# Patient Record
Sex: Female | Born: 1985 | Race: White | Hispanic: No | Marital: Married | Smoking: Never smoker
Health system: Southern US, Community
[De-identification: ages and names within clinical notes are randomized; demographics above are authoritative.]

## PROBLEM LIST (undated history)

## (undated) DIAGNOSIS — Z87440 Personal history of urinary (tract) infections: Secondary | ICD-10-CM

## (undated) DIAGNOSIS — R011 Cardiac murmur, unspecified: Secondary | ICD-10-CM

## (undated) DIAGNOSIS — N12 Tubulo-interstitial nephritis, not specified as acute or chronic: Secondary | ICD-10-CM

## (undated) DIAGNOSIS — D649 Anemia, unspecified: Secondary | ICD-10-CM

## (undated) DIAGNOSIS — E282 Polycystic ovarian syndrome: Secondary | ICD-10-CM

## (undated) DIAGNOSIS — D696 Thrombocytopenia, unspecified: Secondary | ICD-10-CM

## (undated) DIAGNOSIS — R51 Headache: Secondary | ICD-10-CM

## (undated) DIAGNOSIS — F329 Major depressive disorder, single episode, unspecified: Secondary | ICD-10-CM

## (undated) HISTORY — DX: Cardiac murmur, unspecified: R01.1

## (undated) HISTORY — DX: Personal history of urinary (tract) infections: Z87.440

## (undated) HISTORY — DX: Headache: R51

## (undated) HISTORY — PX: COLPOSCOPY W/ BIOPSY / CURETTAGE: SUR283

## (undated) HISTORY — DX: Polycystic ovarian syndrome: E28.2

## (undated) HISTORY — PX: WISDOM TOOTH EXTRACTION: SHX21

## (undated) HISTORY — DX: Anemia, unspecified: D64.9

## (undated) HISTORY — DX: Major depressive disorder, single episode, unspecified: F32.9

## (undated) HISTORY — DX: Tubulo-interstitial nephritis, not specified as acute or chronic: N12

---
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
## 2006-03-02 ENCOUNTER — Ambulatory Visit: Payer: Self-pay | Admitting: Family Medicine

## 2006-12-28 ENCOUNTER — Ambulatory Visit: Payer: Self-pay | Admitting: Family Medicine

## 2006-12-28 ENCOUNTER — Other Ambulatory Visit: Admission: RE | Admit: 2006-12-28 | Discharge: 2006-12-28 | Payer: Self-pay | Admitting: Family Medicine

## 2008-07-28 DIAGNOSIS — D649 Anemia, unspecified: Secondary | ICD-10-CM

## 2008-07-28 HISTORY — DX: Anemia, unspecified: D64.9

## 2009-04-11 ENCOUNTER — Ambulatory Visit: Payer: Self-pay | Admitting: Family Medicine

## 2010-07-28 DIAGNOSIS — Z87440 Personal history of urinary (tract) infections: Secondary | ICD-10-CM

## 2010-07-28 DIAGNOSIS — E282 Polycystic ovarian syndrome: Secondary | ICD-10-CM

## 2010-07-28 HISTORY — DX: Personal history of urinary (tract) infections: Z87.440

## 2010-07-28 HISTORY — DX: Polycystic ovarian syndrome: E28.2

## 2012-02-26 ENCOUNTER — Ambulatory Visit (INDEPENDENT_AMBULATORY_CARE_PROVIDER_SITE_OTHER): Payer: Commercial Indemnity | Admitting: Obstetrics and Gynecology

## 2012-02-26 ENCOUNTER — Encounter: Payer: Self-pay | Admitting: Obstetrics and Gynecology

## 2012-02-26 VITALS — BP 110/72 | HR 78 | Ht 66.0 in | Wt 155.0 lb

## 2012-02-26 DIAGNOSIS — Z124 Encounter for screening for malignant neoplasm of cervix: Secondary | ICD-10-CM

## 2012-02-26 DIAGNOSIS — Z331 Pregnant state, incidental: Secondary | ICD-10-CM

## 2012-02-26 DIAGNOSIS — Z01419 Encounter for gynecological examination (general) (routine) without abnormal findings: Secondary | ICD-10-CM

## 2012-02-26 DIAGNOSIS — N912 Amenorrhea, unspecified: Secondary | ICD-10-CM

## 2012-02-26 NOTE — Progress Notes (Signed)
Regular Periods: no Mammogram: no  Monthly Breast Ex.: yes Exercise: yes  Tetanus < 10 years: yes Seatbelts: yes  NI. Bladder Functn.: yes Abuse at home: no  Daily BM's: yes Stressful Work: yes  Healthy Diet: yes Sigmoid-Colonoscopy: NO  Calcium: no Medical problems this year: NO PROBLEMS   LAST PAP:2012  NL  Contraception: CONDOMS  Mammogram:  NO  PCP:  [facility]  PMH: NO CHANGE  FMH: NO CHANGE  Last Bone Scan: NO

## 2012-02-26 NOTE — Progress Notes (Signed)
Subjective:    Berklee Battey is a 26 y.o. female, G0P0, who presents for an annual exam. The patient reports no period in July-has PCOS. Menstrual cycle:   LMP: Patient's last menstrual period was 12/28/2011.             Review of Systems Pertinent items are noted in HPI. Denies pelvic pain, urinary tract symptoms, vaginitis symptoms, irregular bleeding, menopausal symptoms, change in bowel habits or rectal bleeding   Objective:    BP 110/72  Pulse 78  Ht 5\' 6"  (1.676 m)  Wt 155 lb (70.308 kg)  BMI 25.02 kg/m2  LMP 12/28/2011    Wt Readings from Last 1 Encounters:  02/26/12 155 lb (70.308 kg)   Body mass index is 25.02 kg/(m^2). General Appearance: Alert, no acute distress HEENT: Grossly normal Neck / Thyroid: Supple, no thyromegaly or cervical adenopathy Lungs: Clear to auscultation bilaterally Back: No CVA tenderness Breast Exam: No masses or nodes.No dimpling, nipple retraction or discharge. Cardiovascular: Regular rate and rhythm.  Gastrointestinal: Soft, non-tender, no masses or organomegaly Pelvic Exam: EGBUS-wnl, vagina-normal rugae, cervix- without lesions or tenderness, uterus appears normal size shape and consistency, adnexae-no masses or tenderness Rectovaginal: no masses and normal sphincter tone Lymphatic Exam: Non-palpable nodes in neck, clavicular,  axillary, or inguinal regions  Skin: no rashes or abnormalities Extremities: no clubbing cyanosis or edema  Neurologic: grossly normal Psychiatric: Alert and oriented  UPT: positive   Assessment:   Routine GYN Exam Oligomenorrhea PCOS      Incidental Pregnancy   Plan:  QHCG-pending   PAP sent  RTO 1 year or prn  Kiley Solimine,ELMIRAPA-C

## 2012-02-27 ENCOUNTER — Telehealth: Payer: Self-pay

## 2012-02-27 ENCOUNTER — Other Ambulatory Visit: Payer: Self-pay

## 2012-02-27 ENCOUNTER — Telehealth: Payer: Self-pay | Admitting: Obstetrics and Gynecology

## 2012-02-27 DIAGNOSIS — O3680X0 Pregnancy with inconclusive fetal viability, not applicable or unspecified: Secondary | ICD-10-CM

## 2012-02-27 LAB — PAP IG W/ RFLX HPV ASCU

## 2012-02-27 NOTE — Telephone Encounter (Signed)
LM FOR PT TO CALL BACK. 

## 2012-02-27 NOTE — Progress Notes (Signed)
Quick Note:  Please schedule patient for a dating ultrasound within the next 10-14 days. She has a history of oligomenorrhea and we have no idea how pregnant she is. Thank you. EP ______

## 2012-02-27 NOTE — Telephone Encounter (Signed)
TC TO PT TO SCHEDULE A DATING U/S PER EP. WAS ADVISED TO SCHEDULE THE U/S 10-14 DAYS FROM LAB. APPT SCHEDULED ON 03/09/12 AT 3:30 PM. PT ALSO ASKED ABOUT TAKING ALEVE FOR HEADACHES AND ADVISED PT THAT SHE CAN TAKE IBUPROFEN RIGHT NOW BUT IT IS BEST TO TAKE TYLENOL UNTIL WE SEE HOW FAR ALONG PT IS. PT VOICED UNDERSTANDING

## 2012-02-27 NOTE — Telephone Encounter (Signed)
Triage/tst res °

## 2012-03-01 ENCOUNTER — Other Ambulatory Visit: Payer: Self-pay | Admitting: Obstetrics and Gynecology

## 2012-03-01 NOTE — Telephone Encounter (Signed)
Triage/gen.quest °

## 2012-03-02 NOTE — Telephone Encounter (Signed)
TRIAGE/2ND CALL RX

## 2012-03-03 ENCOUNTER — Telehealth: Payer: Self-pay

## 2012-03-03 NOTE — Telephone Encounter (Signed)
Tc to pt regarding msg.  Lm on vm to call back. 

## 2012-03-03 NOTE — Telephone Encounter (Signed)
TC TO PT REGARDING MESSAGE. PT STATED THAT SHE WANTED TO KNOW ABOUT OTHER SUPPLEMENTS THAT SHE CAN TAKE WHILE PREGNANT. PT STATES THAT SHE DID  CALL HER PCP AND HE TOLD HER A FEW THINGS ABOUT SUPPLEMENTS AND PREGNANCY. SHE ASKED COULD SHE TAKE PROGESTERONE AND I INFORMED HER THAT THE PILL SHE COULD NOT TAKE. ALSO INFORMED PT OF OTHER THINGS SHE COULD TAKE WHILE PREGNANT. PT WANTED HER U/S SCHEDULED FOR EARLIER ON THE 13TH AND I TOLD HER WE COULD SCHEDULE HER AT 11:30 AM. PT VOICED UNDERSTANDING.

## 2012-03-09 ENCOUNTER — Encounter: Payer: Self-pay | Admitting: Obstetrics and Gynecology

## 2012-03-09 ENCOUNTER — Ambulatory Visit (INDEPENDENT_AMBULATORY_CARE_PROVIDER_SITE_OTHER): Payer: Commercial Indemnity

## 2012-03-09 ENCOUNTER — Other Ambulatory Visit: Payer: Self-pay | Admitting: Obstetrics and Gynecology

## 2012-03-09 ENCOUNTER — Ambulatory Visit (INDEPENDENT_AMBULATORY_CARE_PROVIDER_SITE_OTHER): Payer: Commercial Indemnity | Admitting: Obstetrics and Gynecology

## 2012-03-09 VITALS — BP 100/70 | Wt 152.0 lb

## 2012-03-09 DIAGNOSIS — O3680X0 Pregnancy with inconclusive fetal viability, not applicable or unspecified: Secondary | ICD-10-CM

## 2012-03-09 DIAGNOSIS — Z3490 Encounter for supervision of normal pregnancy, unspecified, unspecified trimester: Secondary | ICD-10-CM

## 2012-03-09 DIAGNOSIS — Z331 Pregnant state, incidental: Secondary | ICD-10-CM

## 2012-03-09 LAB — US OB TRANSVAGINAL

## 2012-03-09 NOTE — Progress Notes (Signed)
26 YO patient with PCOS seen 02/26/12 with a positive pregnancy test and questionable LMP of 12/28/11.  QHCG=3458 on 02/26/12 she's here today for a dating ultrasound.  O:  U/S [redacted]w[redacted]d SIUP, FHR=130  A: Viable Preganancy  P: NOB     Continue MVI (has 800 mcg of folic acid)     RTO-as scheduled or prn  Ozias Dicenzo, PA-C

## 2012-03-23 ENCOUNTER — Ambulatory Visit (INDEPENDENT_AMBULATORY_CARE_PROVIDER_SITE_OTHER): Payer: Commercial Indemnity | Admitting: Obstetrics and Gynecology

## 2012-03-23 DIAGNOSIS — Z331 Pregnant state, incidental: Secondary | ICD-10-CM

## 2012-03-23 LAB — POCT URINALYSIS DIPSTICK
Glucose, UA: NEGATIVE
Ketones, UA: NEGATIVE
Leukocytes, UA: NEGATIVE
Spec Grav, UA: 1.01

## 2012-03-23 NOTE — Progress Notes (Signed)
NOB INTERVIEW. EDC BY LMP AND U/S REVIEWED BY DD.  TO USE U/S FOR DATING. PT HAD BEEN TAKING PROGESTERONE TO REGULATE MENSES. PER DD DOES NOT NEED AT THIS TIME.

## 2012-03-24 DIAGNOSIS — Z331 Pregnant state, incidental: Secondary | ICD-10-CM | POA: Insufficient documentation

## 2012-03-24 LAB — PRENATAL PANEL VII
HIV: NONREACTIVE
Hepatitis B Surface Ag: NEGATIVE
Lymphocytes Relative: 30 % (ref 12–46)
Lymphs Abs: 2 10*3/uL (ref 0.7–4.0)
MCV: 90.3 fL (ref 78.0–100.0)
Neutro Abs: 4 10*3/uL (ref 1.7–7.7)
Neutrophils Relative %: 61 % (ref 43–77)
Platelets: 211 10*3/uL (ref 150–400)
RBC: 4.02 MIL/uL (ref 3.87–5.11)
Rubella: 34.8 IU/mL — ABNORMAL HIGH
WBC: 6.5 10*3/uL (ref 4.0–10.5)

## 2012-03-24 LAB — TOXOPLASMA ANTIBODIES- IGG AND  IGM
Toxoplasma Antibody- IgM: <3 IU/mL (ref <8.0)
Toxoplasma IgG Ratio: <3 IU/mL (ref <6.0)

## 2012-03-25 LAB — CULTURE, OB URINE
Colony Count: NO GROWTH
Organism ID, Bacteria: NO GROWTH

## 2012-04-06 ENCOUNTER — Telehealth: Payer: Self-pay | Admitting: Obstetrics and Gynecology

## 2012-04-06 ENCOUNTER — Ambulatory Visit (INDEPENDENT_AMBULATORY_CARE_PROVIDER_SITE_OTHER): Payer: Commercial Indemnity | Admitting: Obstetrics and Gynecology

## 2012-04-06 ENCOUNTER — Other Ambulatory Visit: Payer: Self-pay | Admitting: Obstetrics and Gynecology

## 2012-04-06 ENCOUNTER — Encounter: Payer: Self-pay | Admitting: Obstetrics and Gynecology

## 2012-04-06 VITALS — BP 108/72 | Wt 151.0 lb

## 2012-04-06 DIAGNOSIS — Z87440 Personal history of urinary (tract) infections: Secondary | ICD-10-CM

## 2012-04-06 DIAGNOSIS — R010 Benign and innocent cardiac murmurs: Secondary | ICD-10-CM

## 2012-04-06 DIAGNOSIS — R011 Cardiac murmur, unspecified: Secondary | ICD-10-CM

## 2012-04-06 DIAGNOSIS — E282 Polycystic ovarian syndrome: Secondary | ICD-10-CM

## 2012-04-06 DIAGNOSIS — N898 Other specified noninflammatory disorders of vagina: Secondary | ICD-10-CM

## 2012-04-06 DIAGNOSIS — Z331 Pregnant state, incidental: Secondary | ICD-10-CM

## 2012-04-06 DIAGNOSIS — Z87448 Personal history of other diseases of urinary system: Secondary | ICD-10-CM

## 2012-04-06 LAB — POCT WET PREP (WET MOUNT)

## 2012-04-06 MED ORDER — TERCONAZOLE 0.4 % VA CREA: 1.0000 | TOPICAL_CREAM | Freq: Every day | VAGINAL | Status: AC

## 2012-04-06 NOTE — Progress Notes (Signed)
LAST PAP: 02/26/2012 "WNL" GC/CT today. Pt declines genetic testing. Pt states vaginal discharge is normal. Pt states she is getting more headaches.  Pt requesting U/S today if possible to be sure baby is "OK".

## 2012-04-06 NOTE — Progress Notes (Signed)
Addendum: breast exam neg bilaterally dimpling, drainage, or masses. Lavera Guise, CNM

## 2012-04-06 NOTE — Progress Notes (Signed)
Patient ID: [name], female   DOB: 1986-03-13, 26 y.o.   MRN: [id number] Maria Douglas is a 26 y.o. female presenting for new ob visit. [place] at 6 4/7 weeks will use for dating fo [facility] 10/29/12 @MED  @IPILAPH @ OB History    Grav Para Term Preterm Abortions TAB SAB Ect Mult Living   1              Past Medical History  Diagnosis Date  . PCOS (polycystic ovarian syndrome) 2012  . Hx: UTI (urinary tract infection) 2012  . Heart murmur     HAD LONG TIME; WAS ADVISED PROPHYLAXSIS FOR DENTIST  . Depression 2005    NO MEDS  . Anemia 2010    NO MEDS  . Headache     OCC  HX of heart murmur as infant without problems Past Surgical History  Procedure Date  . Wisdom tooth extraction    Family History: family history includes Anemia in her paternal grandfather; Cancer in her maternal grandfather and paternal grandfather; Depression in her mother; Down syndrome in her paternal uncle; Early death (age of onset:46) in her maternal grandmother; Heart disease in her maternal grandfather, mother, and paternal grandfather; Hypertension in her paternal grandmother; Migraines in her mother; Mitral valve prolapse in her mother; Other in her mother; and Stroke in her maternal grandfather and paternal uncle. Social History:  reports that she has never smoked. She has never used smokeless tobacco. She reports that she drinks about 2 - 2.5 ounces of alcohol per week. She reports that she does not use illicit drugs.  @ROS @    Blood pressure 108/72, weight 151 lb (68.493 kg), last menstrual period 12/28/2011. Physical exam: Calm, no distress, HEENT wnl lungs clear bilaterally, AP RRR, abd soft, gravid, nt, bowel sounds active, abdomen nontender, Fundal height 12 Normal hair distrubition mons pubis,  EGBUS WNL, sterile speculum exam,  vagina pink, moist normal rugae,  cerix LTC, no cervical motion tenderness, No adnexal masses or tenderness Scant white discharge uterus firm.Marland Kitchen..Marland Kitchenweeks size 12 DTR + 2 no  clonus No edema to lower extremities  Prenatal labs: ABO, Rh: A/POS/-- (08/27 0932) Antibody: NEG (08/27 0932) Rubella:  immune RPR: NON REAC (08/27 0932)  HBsAg: NEGATIVE (08/27 0932)  HIV: NON REACTIVE (08/27 0932)  GBS:  na  Assessment/Plan: [redacted]w[redacted]d GC/CHL sent WET PREP positive Hyphae, neg clue and neg trich PAP WNL at [facility] 8/13  ULTRASOUND 6w 4 d for dating terazol 7 discussed, Genetic testing declined Collaboration with Dr. Estanislado Pandy. Maria Douglas 04/06/2012, 11:10 PM Lavera Guise, CNM

## 2012-04-06 NOTE — Telephone Encounter (Signed)
TRIAGE/RX FOLLOW UP °

## 2012-04-07 ENCOUNTER — Telehealth: Payer: Self-pay

## 2012-04-07 LAB — GC/CHLAMYDIA PROBE AMP, GENITAL
Chlamydia, DNA Probe: NEGATIVE
GC Probe Amp, Genital: NEGATIVE

## 2012-04-07 NOTE — Telephone Encounter (Signed)
Rx for Terozole 7 was not sent to her pharm.when she left the office. Rx was e-scribed late Tuesday afternoon after 5pm. I called the pharm to confirm that Rx was received.  [facility] CMA

## 2012-05-04 ENCOUNTER — Ambulatory Visit (INDEPENDENT_AMBULATORY_CARE_PROVIDER_SITE_OTHER): Payer: Commercial Indemnity | Admitting: Obstetrics and Gynecology

## 2012-05-04 VITALS — BP 100/62 | Wt 154.0 lb

## 2012-05-04 DIAGNOSIS — Z331 Pregnant state, incidental: Secondary | ICD-10-CM

## 2012-05-04 NOTE — Progress Notes (Signed)
[redacted]w[redacted]d GC/CT both neg Reviewed PNL Declined quad screen Anatomy scan at [facility] Will do routine glucola testing at 26-28wks (pt had questions about an early glucola and would have been agreeable to it but her only risk factor is the PCOs so I think it would be fine to do routine testing)

## 2012-06-01 ENCOUNTER — Ambulatory Visit (INDEPENDENT_AMBULATORY_CARE_PROVIDER_SITE_OTHER): Payer: Commercial Indemnity | Admitting: Obstetrics and Gynecology

## 2012-06-01 ENCOUNTER — Ambulatory Visit (INDEPENDENT_AMBULATORY_CARE_PROVIDER_SITE_OTHER): Payer: Commercial Indemnity

## 2012-06-01 VITALS — BP 110/60 | Wt 158.0 lb

## 2012-06-01 DIAGNOSIS — Z331 Pregnant state, incidental: Secondary | ICD-10-CM

## 2012-06-01 DIAGNOSIS — Z3689 Encounter for other specified antenatal screening: Secondary | ICD-10-CM

## 2012-06-01 DIAGNOSIS — R300 Dysuria: Secondary | ICD-10-CM

## 2012-06-01 LAB — US OB COMP + 14 WK

## 2012-06-01 NOTE — Progress Notes (Signed)
[redacted]w[redacted]d Anatomy US today  S=D EFW 52% Cx=3.64cm Posterior placenta Normal fluid  No anomalies noted, limited views of: profile, NB, RVOT, LVOT, DA cx closed, nl adenexa and ovaries  Pt denies urinary sx's will send UA for cx secondary to hx of pyelo and frequent UTI Denies quad screen RTO 2wks for f/u US 4wks ROB

## 2012-06-01 NOTE — Progress Notes (Signed)
Pt declines flu shot @ this time 

## 2012-06-01 NOTE — Patient Instructions (Signed)
Round Ligament Pain  The round ligament is made up of muscle and fibrous tissue. It is attached to the uterus near the fallopian tube. The round ligament is located on both sides of the uterus and helps support the position of the uterus. It usually begins in the second trimester of pregnancy when the uterus comes out of the pelvis. The pain can come and go until the baby is delivered. Round ligament pain is not a serious problem and does not cause harm to the baby.  CAUSE  During pregnancy the uterus grows the most from the second trimester to delivery. As it grows, it stretches and slightly twists the round ligaments. When the uterus leans from one side to the other, the round ligament on the opposite side pulls and stretches. This can cause pain.  SYMPTOMS   Pain can occur on one side or both sides. The pain is usually a short, sharp, and pinching-like. Sometimes it can be a dull, lingering and aching pain. The pain is located in the lower side of the abdomen or in the groin. The pain is internal and usually starts deep in the groin and moves up to the outside of the hip area. Pain can occur with:  · Sudden change in position like getting out of bed or a chair.  · Rolling over in bed.  · Coughing or sneezing.  · Walking too much.  · Any type of physical activity.  DIAGNOSIS   Your caregiver will make sure there are no serious problems causing the pain. When nothing serious is found, the symptoms usually indicate that the pain is from the round ligament.  TREATMENT   · Sit down and relax when the pain starts.  · Flex your knees up to your belly.  · Lay on your side with a pillow under your belly (abdomen) and another one between your legs.  · Sit in a hot bath for 15 to 20 minutes or until the pain goes away.  HOME CARE INSTRUCTIONS   · Only take over-the-counter or prescriptions medicines for pain, discomfort or fever as directed by your caregiver.  · Sit and stand slowly.  · Avoid long walks if it causes  pain.  · Stop or lessen your physical activities if it causes pain.  SEEK MEDICAL CARE IF:   · The pain does not go away with any of your treatment.  · You need stronger medication for the pain.  · You develop back pain that you did not have before with the side pain.  SEEK IMMEDIATE MEDICAL CARE IF:   · You develop a temperature of 102° F (38.9° C) or higher.  · You develop uterine contractions.  · You develop vaginal bleeding.  · You develop nausea, vomiting or diarrhea.  · You develop chills.  · You have pain when you urinate.  Document Released: 04/22/2008 Document Revised: 10/06/2011 Document Reviewed: 04/22/2008  ExitCare® Patient Information ©2013 [employer].

## 2012-06-10 ENCOUNTER — Other Ambulatory Visit: Payer: Self-pay

## 2012-06-10 DIAGNOSIS — Z3689 Encounter for other specified antenatal screening: Secondary | ICD-10-CM

## 2012-06-14 ENCOUNTER — Ambulatory Visit (INDEPENDENT_AMBULATORY_CARE_PROVIDER_SITE_OTHER): Payer: Commercial Indemnity | Admitting: Obstetrics and Gynecology

## 2012-06-14 ENCOUNTER — Other Ambulatory Visit: Payer: Self-pay | Admitting: Obstetrics and Gynecology

## 2012-06-14 ENCOUNTER — Ambulatory Visit (INDEPENDENT_AMBULATORY_CARE_PROVIDER_SITE_OTHER): Payer: Commercial Indemnity

## 2012-06-14 VITALS — BP 100/60 | Wt 159.0 lb

## 2012-06-14 DIAGNOSIS — Z3689 Encounter for other specified antenatal screening: Secondary | ICD-10-CM

## 2012-06-14 DIAGNOSIS — Z331 Pregnant state, incidental: Secondary | ICD-10-CM

## 2012-06-14 NOTE — Progress Notes (Signed)
[redacted]w[redacted]d Ultrasound: Single gestation, normal fluid, normal anatomy, cervix 3.35 cm, 14 ounces (57th percentile), female Genetic screening declined. Return to office in 4 weeks. Dr. Stefano Gaul

## 2012-06-14 NOTE — Progress Notes (Signed)
[redacted]w[redacted]d  U/S: Anatomy F/U:  Breech presentation. Posterior placenta. No previa. Normal Fluid, AP pocket = 5.4 CM  Normal Linear growth : EFW = 57th %tile. DA, AA, RVOT, LVOT seen today. Profile and NB not seen due to fetal position.  CX closed. Normal ovaries/adnexa.

## 2012-06-15 LAB — US OB FOLLOW UP

## 2012-06-30 ENCOUNTER — Encounter: Payer: Commercial Indemnity | Admitting: Obstetrics and Gynecology

## 2012-07-12 ENCOUNTER — Ambulatory Visit (INDEPENDENT_AMBULATORY_CARE_PROVIDER_SITE_OTHER): Payer: Commercial Indemnity | Admitting: Obstetrics and Gynecology

## 2012-07-12 VITALS — BP 110/62 | Wt 165.0 lb

## 2012-07-12 DIAGNOSIS — Z331 Pregnant state, incidental: Secondary | ICD-10-CM

## 2012-07-12 NOTE — Progress Notes (Signed)
[redacted]w[redacted]d Pt states she is having some tingling and numbness is leg. No complaints.

## 2012-07-12 NOTE — Progress Notes (Signed)
[redacted]w[redacted]d Doing well. Examination of the extremities is normal. Glucola next visit. Return office in 4 weeks. Dr. Stefano Gaul

## 2012-07-28 NOTE — L&D Delivery Note (Signed)
Delivery Note  Labored down about an hour, then had stronger urge to push, FHR remained reassuring w occ early variable and more frequent variables just prior to delivery, pushed approximately 1 hour   At 4:04 PM a viable female was delivered via Vaginal, Spontaneous Delivery (Presentation: Left Occiput Anterior).  Loose nuchal cord reduced, shoulders delivered easily, APGAR: 7, 9; weight 7 lb 9.7 oz (3450 g).   Placenta status: Intact, Spontaneous.  Cord: 3 vessels with the following complications: None.  Cord pH: n/a   Anesthesia: Epidural, local  Episiotomy: None Lacerations: periurethral, 2nd degree perineal  Suture Repair: 3.0 vicryl rapide 4-0 monocryl Est. Blood Loss (mL): 300  Red robinson catheter placed and left in place for visualization and repair of periurethral laceration, removed when repair completed   Mom to postpartum.  Baby to nursery-stable. Pt plans to BF Desires inpatient circumcision Infant remains skin-skin  Routine PP orders   Veneda Kirksey M 11/08/2012, 1:14 AM

## 2012-08-09 ENCOUNTER — Other Ambulatory Visit: Payer: Commercial Indemnity

## 2012-08-09 ENCOUNTER — Ambulatory Visit (INDEPENDENT_AMBULATORY_CARE_PROVIDER_SITE_OTHER): Payer: Commercial Indemnity | Admitting: Obstetrics and Gynecology

## 2012-08-09 ENCOUNTER — Encounter: Payer: Self-pay | Admitting: Obstetrics and Gynecology

## 2012-08-09 VITALS — BP 110/68 | Wt 169.0 lb

## 2012-08-09 DIAGNOSIS — Z331 Pregnant state, incidental: Secondary | ICD-10-CM

## 2012-08-09 NOTE — Progress Notes (Signed)
Glucola given today. 

## 2012-08-09 NOTE — Progress Notes (Signed)
[redacted]w[redacted]d A/P Glucola, hemoglobin and RPR today Fetal kick counts reviewed All patients questions answered Return in two weeks Continue Prenatal vitamins Blood type A pos

## 2012-08-09 NOTE — Patient Instructions (Signed)
Fetal Movement Counts Patient Name: __________________________________________________ Patient Due Date: ____________________ Kick counts is highly recommended in high risk pregnancies, but it is a good idea for every pregnant woman to do. Start counting fetal movements at 28 weeks of the pregnancy. Fetal movements increase after eating a full meal or eating or drinking something sweet (the blood sugar is higher). It is also important to drink plenty of fluids (well hydrated) before doing the count. Lie on your left side because it helps with the circulation or you can sit in a comfortable chair with your arms over your belly (abdomen) with no distractions around you. DOING THE COUNT  Try to do the count the same time of day each time you do it.  Mark the day and time, then see how long it takes for you to feel 10 movements (kicks, flutters, swishes, rolls). You should have at least 10 movements within 2 hours. You will most likely feel 10 movements in much less than 2 hours. If you do not, wait an hour and count again. After a couple of days you will see a pattern.  What you are looking for is a change in the pattern or not enough counts in 2 hours. Is it taking longer in time to reach 10 movements? SEEK MEDICAL CARE IF:  You feel less than 10 counts in 2 hours. Tried twice.  No movement in one hour.  The pattern is changing or taking longer each day to reach 10 counts in 2 hours.  You feel the baby is not moving as it usually does. Date: ____________ Movements: ____________ Start time: ____________ Finish time: ____________  Date: ____________ Movements: ____________ Start time: ____________ Finish time: ____________ Date: ____________ Movements: ____________ Start time: ____________ Finish time: ____________ Date: ____________ Movements: ____________ Start time: ____________ Finish time: ____________ Date: ____________ Movements: ____________ Start time: ____________ Finish time:  ____________ Date: ____________ Movements: ____________ Start time: ____________ Finish time: ____________ Date: ____________ Movements: ____________ Start time: ____________ Finish time: ____________ Date: ____________ Movements: ____________ Start time: ____________ Finish time: ____________  Date: ____________ Movements: ____________ Start time: ____________ Finish time: ____________ Date: ____________ Movements: ____________ Start time: ____________ Finish time: ____________ Date: ____________ Movements: ____________ Start time: ____________ Finish time: ____________ Date: ____________ Movements: ____________ Start time: ____________ Finish time: ____________ Date: ____________ Movements: ____________ Start time: ____________ Finish time: ____________ Date: ____________ Movements: ____________ Start time: ____________ Finish time: ____________ Date: ____________ Movements: ____________ Start time: ____________ Finish time: ____________  Date: ____________ Movements: ____________ Start time: ____________ Finish time: ____________ Date: ____________ Movements: ____________ Start time: ____________ Finish time: ____________ Date: ____________ Movements: ____________ Start time: ____________ Finish time: ____________ Date: ____________ Movements: ____________ Start time: ____________ Finish time: ____________ Date: ____________ Movements: ____________ Start time: ____________ Finish time: ____________ Date: ____________ Movements: ____________ Start time: ____________ Finish time: ____________ Date: ____________ Movements: ____________ Start time: ____________ Finish time: ____________  Date: ____________ Movements: ____________ Start time: ____________ Finish time: ____________ Date: ____________ Movements: ____________ Start time: ____________ Finish time: ____________ Date: ____________ Movements: ____________ Start time: ____________ Finish time: ____________ Date: ____________ Movements:  ____________ Start time: ____________ Finish time: ____________ Date: ____________ Movements: ____________ Start time: ____________ Finish time: ____________ Date: ____________ Movements: ____________ Start time: ____________ Finish time: ____________ Date: ____________ Movements: ____________ Start time: ____________ Finish time: ____________  Date: ____________ Movements: ____________ Start time: ____________ Finish time: ____________ Date: ____________ Movements: ____________ Start time: ____________ Finish time: ____________ Date: ____________ Movements: ____________ Start time: ____________ Finish time: ____________ Date: ____________ Movements:   ____________ Start time: ____________ Finish time: ____________ Date: ____________ Movements: ____________ Start time: ____________ Finish time: ____________ Date: ____________ Movements: ____________ Start time: ____________ Finish time: ____________ Date: ____________ Movements: ____________ Start time: ____________ Finish time: ____________  Date: ____________ Movements: ____________ Start time: ____________ Finish time: ____________ Date: ____________ Movements: ____________ Start time: ____________ Finish time: ____________ Date: ____________ Movements: ____________ Start time: ____________ Finish time: ____________ Date: ____________ Movements: ____________ Start time: ____________ Finish time: ____________ Date: ____________ Movements: ____________ Start time: ____________ Finish time: ____________ Date: ____________ Movements: ____________ Start time: ____________ Finish time: ____________ Date: ____________ Movements: ____________ Start time: ____________ Finish time: ____________  Date: ____________ Movements: ____________ Start time: ____________ Finish time: ____________ Date: ____________ Movements: ____________ Start time: ____________ Finish time: ____________ Date: ____________ Movements: ____________ Start time: ____________ Finish  time: ____________ Date: ____________ Movements: ____________ Start time: ____________ Finish time: ____________ Date: ____________ Movements: ____________ Start time: ____________ Finish time: ____________ Date: ____________ Movements: ____________ Start time: ____________ Finish time: ____________ Date: ____________ Movements: ____________ Start time: ____________ Finish time: ____________  Date: ____________ Movements: ____________ Start time: ____________ Finish time: ____________ Date: ____________ Movements: ____________ Start time: ____________ Finish time: ____________ Date: ____________ Movements: ____________ Start time: ____________ Finish time: ____________ Date: ____________ Movements: ____________ Start time: ____________ Finish time: ____________ Date: ____________ Movements: ____________ Start time: ____________ Finish time: ____________ Date: ____________ Movements: ____________ Start time: ____________ Finish time: ____________ Document Released: 08/13/2006 Document Revised: 10/06/2011 Document Reviewed: 02/13/2009 Exit[employer] Patient Information 2013 [employer].  

## 2012-08-23 ENCOUNTER — Ambulatory Visit: Payer: Commercial Indemnity | Admitting: Obstetrics and Gynecology

## 2012-08-23 VITALS — BP 104/62 | Wt 169.0 lb

## 2012-08-23 DIAGNOSIS — Z331 Pregnant state, incidental: Secondary | ICD-10-CM

## 2012-08-23 NOTE — Progress Notes (Signed)
[redacted]w[redacted]d Normal 1hr gtt 87, Hgb normal and RPR non reactive. No complaints today.

## 2012-08-23 NOTE — Progress Notes (Signed)
[redacted]w[redacted]d Doing well. Hemoglobin 12.  Glucola 87.  RPR nonreactive. Return office in 2 weeks. Dr. Stefano Gaul

## 2012-09-06 ENCOUNTER — Ambulatory Visit: Payer: Commercial Indemnity | Admitting: Obstetrics and Gynecology

## 2012-09-06 VITALS — BP 102/64 | Wt 174.0 lb

## 2012-09-06 DIAGNOSIS — Z331 Pregnant state, incidental: Secondary | ICD-10-CM

## 2012-09-06 NOTE — Progress Notes (Signed)
[redacted]w[redacted]d The patient complains of occasional increase in her heart rate.  No shortness of breath or chest pain. Grade 1/6 systolic murmur.  Chest is clear. Return to office in 2 weeks. Dr. Stefano Gaul

## 2012-09-06 NOTE — Progress Notes (Signed)
Pt stated noticed her heart rate would speed up sometimes. Pt's mom has the MVP.pt stated no other issues today. Pulse 76

## 2012-09-20 ENCOUNTER — Ambulatory Visit: Payer: Commercial Indemnity | Admitting: Obstetrics and Gynecology

## 2012-09-20 ENCOUNTER — Encounter: Payer: Self-pay | Admitting: Obstetrics and Gynecology

## 2012-09-20 VITALS — BP 102/70 | Wt 177.0 lb

## 2012-09-20 DIAGNOSIS — Z331 Pregnant state, incidental: Secondary | ICD-10-CM

## 2012-09-20 NOTE — Progress Notes (Signed)
[redacted]w[redacted]d Pt w/o complaint.  Gc/chl/gbs and chem 10 u/a at [facility]

## 2012-10-04 ENCOUNTER — Encounter: Payer: Self-pay | Admitting: Obstetrics and Gynecology

## 2012-10-04 ENCOUNTER — Ambulatory Visit: Payer: Commercial Indemnity | Admitting: Obstetrics and Gynecology

## 2012-10-04 VITALS — BP 100/64 | Wt 176.0 lb

## 2012-10-04 LAB — POCT URINALYSIS DIPSTICK
Bilirubin, UA: NEGATIVE
Glucose, UA: NEGATIVE
Leukocytes, UA: NEGATIVE
Nitrite, UA: NEGATIVE

## 2012-10-04 NOTE — Progress Notes (Signed)
Pt declines flu shot at this time.

## 2012-10-04 NOTE — Progress Notes (Signed)
[redacted]w[redacted]d A/P GBS done today.  Pt declined GC and CHLAm Fetal kick counts reviewed Labor reviewed with pt All patients  questions answered

## 2012-10-06 LAB — STREP B DNA PROBE: GBSP: NEGATIVE

## 2012-10-11 ENCOUNTER — Ambulatory Visit: Payer: Commercial Indemnity | Admitting: Family Medicine

## 2012-10-11 VITALS — BP 122/62 | Wt 182.0 lb

## 2012-10-11 DIAGNOSIS — Z331 Pregnant state, incidental: Secondary | ICD-10-CM

## 2012-10-11 NOTE — Progress Notes (Signed)
[redacted]w[redacted]d No complaints today.

## 2012-10-11 NOTE — Progress Notes (Signed)
[redacted]w[redacted]d Doing well, good fetal movement.   Denies contractions, declines cervical exam today. ROB in 1 week. L.Givanni Staron, FNP-BC

## 2012-11-05 ENCOUNTER — Encounter (HOSPITAL_COMMUNITY): Payer: Self-pay | Admitting: *Deleted

## 2012-11-05 ENCOUNTER — Inpatient Hospital Stay (HOSPITAL_COMMUNITY)
Admission: AD | Admit: 2012-11-05 | Discharge: 2012-11-05 | Disposition: A | Discharge status: Home / Self Care | Payer: Self-pay | Source: Ambulatory Visit | Attending: Obstetrics and Gynecology | Admitting: Obstetrics and Gynecology

## 2012-11-05 DIAGNOSIS — Z0371 Encounter for suspected problem with amniotic cavity and membrane ruled out: Secondary | ICD-10-CM

## 2012-11-05 DIAGNOSIS — O99891 Other specified diseases and conditions complicating pregnancy: Secondary | ICD-10-CM | POA: Insufficient documentation

## 2012-11-05 NOTE — MAU Note (Signed)
Pt presents with complaints of leakage of fluid that started at approximately 1145 today with clear fluid.

## 2012-11-05 NOTE — Progress Notes (Signed)
Ambulated pt around the unit while on pulsox monitor. Pt's O2 levels dropped to 93 at worst. Pt states she still feels pressure on her chest. Joseph Berkshire PA notified

## 2012-11-05 NOTE — MAU Provider Note (Signed)
History  27yo, G1P0 at [redacted]w[redacted]d presents after calling the office with potential SROM. Small gush at 1145 after getting up out of bed.  Denies VB or UC/cramping.  GBS neg.  No chief complaint on file.   OB History   Grav Para Term Preterm Abortions TAB SAB Ect Mult Living   1               Past Medical History  Diagnosis Date  . PCOS (polycystic ovarian syndrome) 2012  . Hx: UTI (urinary tract infection) 2012  . Heart murmur     HAD LONG TIME; WAS ADVISED PROPHYLAXSIS FOR DENTIST  . Depression 2005    NO MEDS  . Anemia 2010    NO MEDS  . Headache     OCC    Past Surgical History  Procedure Laterality Date  . Wisdom tooth extraction      Family History  Problem Relation Age of Onset  . Mitral valve prolapse Mother   . Migraines Mother   . Depression Mother   . Heart disease Mother     MVP  . Other Mother     VARICOSE VEINS  . Stroke Paternal Uncle   . Down syndrome Paternal Uncle   . Cancer Paternal Grandfather     COLON  . Anemia Paternal Grandfather   . Heart disease Paternal Grandfather   . Early death Maternal Grandmother 17    DIED DURING SURGERY  . Cancer Maternal Grandfather     COLON  . Heart disease Maternal Grandfather   . Stroke Maternal Grandfather   . Hypertension Paternal Grandmother     History  Substance Use Topics  . Smoking status: Never Smoker   . Smokeless tobacco: Never Used  . Alcohol Use: 2 - 2.5 oz/week    4-5 drink(s) per week     Comment: MIXED DRINKS; BEER; D/C'D 02/24/12    Allergies:  Allergies  Allergen Reactions  . Minocycline Swelling    Itching    Prescriptions prior to admission  Medication Sig Dispense Refill  . Ascorbic Acid (VITAMIN C) 100 MG tablet Take 100 mg by mouth daily.      . cholecalciferol (VITAMIN D) 1000 UNITS tablet Take 1,000 Units by mouth daily.      . COD LIVER OIL PO Take by mouth.      . Multiple Vitamin (MULTIVITAMIN) tablet Take by mouth daily.      . Prenatal Vit-Fe Sulfate-FA (PRENATAL  VITAMIN PO) Take by mouth.      . vitamin B-12 (CYANOCOBALAMIN) 250 MCG tablet Take 250 mcg by mouth daily.        ROS: See HPI above, all other systems negative  Physical Exam   Filed Vitals:   11/05/12 1448  BP: 118/88  Pulse: 99  Temp: 98.7 F (37.1 C)  TempSrc: Oral  Resp: 18   Last menstrual period 12/28/2011.  Chest: Clear Heart: RRR Abdomen: gravid, NT Pelvic:1-2 / 30% / -3  Posterior;  No VB noted Extremities: WNL  NST reactive UC: occational  ED Course  IUP at [redacted]w[redacted]d ? SROM Amnisure negative  Discharge home Encouraged to call with any questions or concerns or if symptoms return or worsen Pt instructions given   Haroldine Laws CNM, [place] 11/05/2012 2:31 PM

## 2012-11-07 ENCOUNTER — Inpatient Hospital Stay (HOSPITAL_COMMUNITY): Payer: Managed Care, Other (non HMO) | Admitting: Anesthesiology

## 2012-11-07 ENCOUNTER — Encounter (HOSPITAL_COMMUNITY): Payer: Self-pay | Admitting: Anesthesiology

## 2012-11-07 ENCOUNTER — Encounter (HOSPITAL_COMMUNITY): Payer: Self-pay

## 2012-11-07 ENCOUNTER — Inpatient Hospital Stay (HOSPITAL_COMMUNITY)
Admission: AD | Admit: 2012-11-07 | Discharge: 2012-11-09 | DRG: 775 | Disposition: A | Discharge status: Home / Self Care | Payer: Managed Care, Other (non HMO) | Source: Ambulatory Visit | Attending: Obstetrics and Gynecology | Admitting: Obstetrics and Gynecology

## 2012-11-07 DIAGNOSIS — O480 Post-term pregnancy: Principal | ICD-10-CM | POA: Diagnosis present

## 2012-11-07 DIAGNOSIS — IMO0001 Reserved for inherently not codable concepts without codable children: Secondary | ICD-10-CM

## 2012-11-07 DIAGNOSIS — O34599 Maternal care for other abnormalities of gravid uterus, unspecified trimester: Secondary | ICD-10-CM | POA: Diagnosis present

## 2012-11-07 DIAGNOSIS — E282 Polycystic ovarian syndrome: Secondary | ICD-10-CM | POA: Diagnosis present

## 2012-11-07 LAB — CBC
HCT: 36.1 % (ref 36.0–46.0)
MCV: 92.6 fL (ref 78.0–100.0)
Platelets: 120 10*3/uL — ABNORMAL LOW (ref 150–400)
RBC: 3.9 MIL/uL (ref 3.87–5.11)
WBC: 9.7 10*3/uL (ref 4.0–10.5)

## 2012-11-07 MED ORDER — FLEET ENEMA 7-19 GM/118ML RE ENEM: 1.0000 | ENEMA | Freq: Every day | RECTAL | Status: DC | PRN

## 2012-11-07 MED ORDER — PHENYLEPHRINE 40 MCG/ML (10ML) SYRINGE FOR IV PUSH (FOR BLOOD PRESSURE SUPPORT)
80.0000 ug | PREFILLED_SYRINGE | INTRAVENOUS | Status: DC | PRN
  Filled 2012-11-07: qty 2
  Filled 2012-11-07: qty 5

## 2012-11-07 MED ORDER — OXYCODONE-ACETAMINOPHEN 5-325 MG PO TABS: 1.0000 | ORAL_TABLET | ORAL | Status: DC | PRN

## 2012-11-07 MED ORDER — ACETAMINOPHEN 325 MG PO TABS: 650.0000 mg | ORAL_TABLET | ORAL | Status: DC | PRN

## 2012-11-07 MED ORDER — WITCH HAZEL-GLYCERIN EX PADS
1.0000 "application " | MEDICATED_PAD | CUTANEOUS | Status: DC | PRN
  Administered 2012-11-08: 1 via TOPICAL

## 2012-11-07 MED ORDER — OXYTOCIN 40 UNITS IN LACTATED RINGERS INFUSION - SIMPLE MED
62.5000 mL/h | INTRAVENOUS | Status: DC
  Administered 2012-11-07: 62.5 mL/h via INTRAVENOUS

## 2012-11-07 MED ORDER — LANOLIN HYDROUS EX OINT: TOPICAL_OINTMENT | CUTANEOUS | Status: DC | PRN

## 2012-11-07 MED ORDER — ZOLPIDEM TARTRATE 5 MG PO TABS: 5.0000 mg | ORAL_TABLET | Freq: Every evening | ORAL | Status: DC | PRN

## 2012-11-07 MED ORDER — LACTATED RINGERS IV SOLN
500.0000 mL | INTRAVENOUS | Status: DC | PRN
  Administered 2012-11-07: 500 mL via INTRAVENOUS
  Administered 2012-11-07: 1000 mL via INTRAVENOUS

## 2012-11-07 MED ORDER — SIMETHICONE 80 MG PO CHEW: 80.0000 mg | CHEWABLE_TABLET | ORAL | Status: DC | PRN

## 2012-11-07 MED ORDER — IBUPROFEN 600 MG PO TABS: 600.0000 mg | ORAL_TABLET | Freq: Four times a day (QID) | ORAL | Status: DC | PRN

## 2012-11-07 MED ORDER — FENTANYL 2.5 MCG/ML BUPIVACAINE 1/10 % EPIDURAL INFUSION (WH - ANES)
14.0000 mL/h | INTRAMUSCULAR | Status: DC | PRN
  Administered 2012-11-07: 14 mL/h via EPIDURAL
  Filled 2012-11-07 (×2): qty 125

## 2012-11-07 MED ORDER — SENNOSIDES-DOCUSATE SODIUM 8.6-50 MG PO TABS
2.0000 | ORAL_TABLET | Freq: Every day | ORAL | Status: DC
  Administered 2012-11-07 – 2012-11-08 (×2): 2 via ORAL

## 2012-11-07 MED ORDER — ONDANSETRON HCL 4 MG/2ML IJ SOLN: 4.0000 mg | INTRAMUSCULAR | Status: DC | PRN

## 2012-11-07 MED ORDER — ONDANSETRON HCL 4 MG/2ML IJ SOLN
4.0000 mg | Freq: Four times a day (QID) | INTRAMUSCULAR | Status: DC | PRN
  Administered 2012-11-07: 4 mg via INTRAVENOUS
  Filled 2012-11-07: qty 2

## 2012-11-07 MED ORDER — EPHEDRINE 5 MG/ML INJ
10.0000 mg | INTRAVENOUS | Status: DC | PRN
  Filled 2012-11-07: qty 4
  Filled 2012-11-07: qty 2

## 2012-11-07 MED ORDER — FENTANYL 2.5 MCG/ML BUPIVACAINE 1/10 % EPIDURAL INFUSION (WH - ANES)
INTRAMUSCULAR | Status: DC | PRN
  Administered 2012-11-07: 14 mL/h via EPIDURAL

## 2012-11-07 MED ORDER — LIDOCAINE HCL (PF) 1 % IJ SOLN
30.0000 mL | INTRAMUSCULAR | Status: AC | PRN
  Administered 2012-11-07: 30 mL via SUBCUTANEOUS
  Filled 2012-11-07 (×3): qty 30

## 2012-11-07 MED ORDER — CITRIC ACID-SODIUM CITRATE 334-500 MG/5ML PO SOLN: 30.0000 mL | ORAL | Status: DC | PRN

## 2012-11-07 MED ORDER — SODIUM CHLORIDE 0.9 % IJ SOLN: 3.0000 mL | INTRAMUSCULAR | Status: DC | PRN

## 2012-11-07 MED ORDER — PRENATAL MULTIVITAMIN CH
1.0000 | ORAL_TABLET | Freq: Every day | ORAL | Status: DC
  Administered 2012-11-08 – 2012-11-09 (×2): 1 via ORAL
  Filled 2012-11-07 (×2): qty 1

## 2012-11-07 MED ORDER — LACTATED RINGERS IV SOLN
500.0000 mL | Freq: Once | INTRAVENOUS | Status: AC
  Administered 2012-11-07: 500 mL via INTRAVENOUS

## 2012-11-07 MED ORDER — MEASLES, MUMPS & RUBELLA VAC ~~LOC~~ INJ
0.5000 mL | INJECTION | Freq: Once | SUBCUTANEOUS | Status: DC
  Filled 2012-11-07: qty 0.5

## 2012-11-07 MED ORDER — SODIUM CHLORIDE 0.9 % IJ SOLN: 3.0000 mL | Freq: Two times a day (BID) | INTRAMUSCULAR | Status: DC

## 2012-11-07 MED ORDER — OXYTOCIN 40 UNITS IN LACTATED RINGERS INFUSION - SIMPLE MED
1.0000 m[IU]/min | INTRAVENOUS | Status: DC
  Administered 2012-11-07: 2 m[IU]/min via INTRAVENOUS
  Filled 2012-11-07: qty 1000

## 2012-11-07 MED ORDER — FENTANYL CITRATE 0.05 MG/ML IJ SOLN: 100.0000 ug | INTRAMUSCULAR | Status: DC | PRN

## 2012-11-07 MED ORDER — LACTATED RINGERS IV SOLN
INTRAVENOUS | Status: DC
  Administered 2012-11-07: 14:00:00 via INTRAUTERINE

## 2012-11-07 MED ORDER — BENZOCAINE-MENTHOL 20-0.5 % EX AERO
1.0000 "application " | INHALATION_SPRAY | CUTANEOUS | Status: DC | PRN
  Administered 2012-11-07: 1 via TOPICAL
  Filled 2012-11-07: qty 56

## 2012-11-07 MED ORDER — OXYTOCIN BOLUS FROM INFUSION: 500.0000 mL | INTRAVENOUS | Status: DC

## 2012-11-07 MED ORDER — DIBUCAINE 1 % RE OINT
1.0000 "application " | TOPICAL_OINTMENT | RECTAL | Status: DC | PRN
  Administered 2012-11-08: 1 via RECTAL
  Filled 2012-11-07: qty 28

## 2012-11-07 MED ORDER — SODIUM CHLORIDE 0.9 % IV SOLN: 250.0000 mL | INTRAVENOUS | Status: DC | PRN

## 2012-11-07 MED ORDER — MEDROXYPROGESTERONE ACETATE 150 MG/ML IM SUSP: 150.0000 mg | INTRAMUSCULAR | Status: DC | PRN

## 2012-11-07 MED ORDER — DIPHENHYDRAMINE HCL 25 MG PO CAPS: 25.0000 mg | ORAL_CAPSULE | Freq: Four times a day (QID) | ORAL | Status: DC | PRN

## 2012-11-07 MED ORDER — LACTATED RINGERS IV SOLN
INTRAVENOUS | Status: DC
  Administered 2012-11-07: 1000 mL via INTRAVENOUS
  Administered 2012-11-07 (×4): via INTRAVENOUS

## 2012-11-07 MED ORDER — DIPHENHYDRAMINE HCL 50 MG/ML IJ SOLN: 12.5000 mg | INTRAMUSCULAR | Status: DC | PRN

## 2012-11-07 MED ORDER — BISACODYL 10 MG RE SUPP: 10.0000 mg | Freq: Every day | RECTAL | Status: DC | PRN

## 2012-11-07 MED ORDER — EPHEDRINE 5 MG/ML INJ
10.0000 mg | INTRAVENOUS | Status: DC | PRN
  Filled 2012-11-07: qty 2

## 2012-11-07 MED ORDER — ONDANSETRON HCL 4 MG PO TABS: 4.0000 mg | ORAL_TABLET | ORAL | Status: DC | PRN

## 2012-11-07 MED ORDER — IBUPROFEN 600 MG PO TABS
600.0000 mg | ORAL_TABLET | Freq: Four times a day (QID) | ORAL | Status: DC
  Administered 2012-11-07 – 2012-11-09 (×8): 600 mg via ORAL
  Filled 2012-11-07 (×8): qty 1

## 2012-11-07 MED ORDER — PHENYLEPHRINE 40 MCG/ML (10ML) SYRINGE FOR IV PUSH (FOR BLOOD PRESSURE SUPPORT)
80.0000 ug | PREFILLED_SYRINGE | INTRAVENOUS | Status: DC | PRN
  Filled 2012-11-07: qty 2

## 2012-11-07 MED ORDER — LIDOCAINE HCL (PF) 1 % IJ SOLN
INTRAMUSCULAR | Status: DC | PRN
  Administered 2012-11-07 (×2): 5 mL
  Administered 2012-11-07: 3 mL

## 2012-11-07 MED ORDER — TETANUS-DIPHTH-ACELL PERTUSSIS 5-2.5-18.5 LF-MCG/0.5 IM SUSP: 0.5000 mL | Freq: Once | INTRAMUSCULAR | Status: DC

## 2012-11-07 NOTE — Progress Notes (Signed)
Subjective: Pt desires epidural now.  Husband and 2 female guests are at bs.  Pt is breathing heavily w/ ctxs and face red w/ ctxs.    Objective: BP 139/75  Temp(Src) 99.1 F (37.3 C) (Axillary)  Resp 20  Ht 5\' 6"  (1.676 m)  Wt 178 lb (80.74 kg)  BMI 28.74 kg/m2  LMP 12/28/2011      FHT:  FHR: 145 bpm, variability: moderate,  accelerations:  Present,  decelerations:  Absent UC:   irregular, every 2-6 minutes SVE:   Dilation: 5 Effacement (%): 100 Station: -1 Exam by:: Ryane Canavan, CNM Posterior, BBOW Labs: Lab Results  Component Value Date   WBC 9.7 11/07/2012   HGB 13.1 11/07/2012   HCT 36.1 11/07/2012   MCV 92.6 11/07/2012   PLT 120* 11/07/2012    Assessment / Plan: 1. [redacted]w[redacted]d 2. spontaneous labor 3. GBS neg  Labor: active labor Preeclampsia:  no signs or symptoms of toxicity Fetal Wellbeing:  Category I Pain Control:  Labor support without medications I/D:  n/a Anticipated MOD:  NSVD 1. Will proceed w/ epidural now.   2. AROM/Pitocin prn further augmentation. 3. C/w MD prn  Hajra Port H 11/07/2012, 5:15 AM

## 2012-11-07 NOTE — Anesthesia Procedure Notes (Signed)
Epidural Patient location during procedure: OB  Staffing Anesthesiologist: Brookelynn Hamor Performed by: anesthesiologist   Preanesthetic Checklist Completed: patient identified, site marked, surgical consent, pre-op evaluation, timeout performed, IV checked, risks and benefits discussed and monitors and equipment checked  Epidural Patient position: sitting Prep: ChloraPrep Patient monitoring: heart rate, continuous pulse ox and blood pressure Approach: right paramedian Injection technique: LOR saline  Needle:  Needle type: Tuohy  Needle gauge: 17 G Needle length: 9 cm and 9 Needle insertion depth: 6 cm Catheter type: closed end flexible Catheter size: 20 Guage Catheter at skin depth: 10 cm Test dose: negative  Assessment Events: blood not aspirated, injection not painful, no injection resistance, negative IV test and no paresthesia  Additional Notes   Patient tolerated the insertion well without complications.   

## 2012-11-07 NOTE — MAU Note (Signed)
Contractions strong and regular all day.

## 2012-11-07 NOTE — Anesthesia Preprocedure Evaluation (Signed)

## 2012-11-07 NOTE — H&P (Signed)
Maria Douglas is a 27 y.o.white female presenting unannounced in early active labor at [redacted]w[redacted]d. Reports some bloody show.  No LOF.  Seen in MAU Friday to r/o ROM.  Onset of ctxs "Friday night."  Denies PIH or UTI s/s.  No recent illness or fever.  Accompanied by her husband, [name].  Prenatal course: Pt entered care at [redacted] weeks gestation. H/o PCOS, so EDC set by [redacted]w[redacted]d u/s.  Pt had NOB w/u at [redacted]w[redacted]d.  Pt's pregnancy has been uncomplicated.  Declined aneuploidy screens.  Anatomy u/s at [redacted]w[redacted]d WNL, except profile and some cardiac views not seen, so scan repeated at [redacted]w[redacted]d and previously unseen anatomy completed and WNL.  Normal 1hr gtt=87.  Pt had spotting w/ wiping 3/30 & 3/31, but eval WNL.    Maternal Medical History:  Reason for admission: Contractions.   Contractions: Onset was 2 days ago.   Frequency: regular.   Perceived severity is moderate.    Fetal activity: Perceived fetal activity is normal.   Last perceived fetal movement was within the past hour.      OB History   Grav Para Term Preterm Abortions TAB SAB Ect Mult Living   1              Past Medical History  Diagnosis Date  . PCOS (polycystic ovarian syndrome) 2012  . Hx: UTI (urinary tract infection) 2012  . Heart murmur     HAD LONG TIME; WAS ADVISED PROPHYLAXSIS FOR DENTIST  . Depression 2005    NO MEDS  . Anemia 2010    NO MEDS  . Headache     OCC   Past Surgical History  Procedure Laterality Date  . Wisdom tooth extraction     Family History: family history includes Anemia in her paternal grandfather; Cancer in her maternal grandfather and paternal grandfather; Depression in her mother; Down syndrome in her paternal uncle; Early death (age of onset: 43) in her maternal grandmother; Heart disease in her maternal grandfather, mother, and paternal grandfather; Hypertension in her paternal grandmother; Migraines in her mother; Mitral valve prolapse in her mother; Other in her mother; and Stroke in her maternal  grandfather and paternal uncle. Social History:  reports that she has never smoked. She has never used smokeless tobacco. She reports that she drinks about 2.0 ounces of alcohol per week. She reports that she does not use illicit drugs.   Prenatal Transfer Tool  Maternal Diabetes: No Genetic Screening: Declined Maternal Ultrasounds/Referrals: Normal Fetal Ultrasounds or other Referrals:  None Maternal Substance Abuse:  No Significant Maternal Medications:  None Significant Maternal Lab Results:  Lab values include: Group B Strep negative Other Comments:  None  Review of Systems  Constitutional: Negative.   HENT: Negative.   Eyes: Negative.   Respiratory: Negative.   Cardiovascular: Negative.   Gastrointestinal: Negative.   Genitourinary: Negative.   Skin: Negative.   Neurological: Negative.     Dilation: 3 Effacement (%): 100 Station: -1 Exam by:: H. SteelmanCNM Blood pressure 124/88, temperature 98.6 F (37 C), temperature source Oral, resp. rate 18, height 5\' 6"  (1.676 m), weight 178 lb 3.2 oz (80.831 kg), last menstrual period 12/28/2011. Maternal Exam:  Uterine Assessment: Contraction strength is moderate.  Contraction frequency is irregular.   Abdomen: Patient reports no abdominal tenderness. Fetal presentation: vertex  Introitus: Normal vulva. Pelvis: adequate for delivery.   Cervix: Cervix evaluated by sterile speculum exam.     Fetal Exam Fetal Monitor Review: Mode: ultrasound.   Baseline rate:  140.  Variability: moderate (6-25 bpm).   Pattern: accelerations present and no decelerations.    Fetal State Assessment: Category I - tracings are normal.     Physical Exam  Constitutional: She is oriented to person, place, and time. She appears well-developed and well-nourished. She appears distressed.  Tense during ctxs, breathes some, but also holds her breath at times   HENT:  Head: Normocephalic and atraumatic.  Eyes: Pupils are equal, round, and reactive  to light.  Glasses on   Cardiovascular: Normal rate.   Respiratory: Effort normal.  GI: Soft.  gravid  Genitourinary:  Cx: 3/90/-1, posterior, BBOW  Musculoskeletal: She exhibits edema.  1+ BLE pitting edema; one beat of clonus BLE  Neurological: She is alert and oriented to person, place, and time. She displays abnormal reflex.  Brisk DTRs, 3+ BLE  Skin: Skin is warm and dry.    Prenatal labs: ABO, Rh: A/POS/-- (08/27 0932) Antibody: NEG (08/27 0932) Rubella: 34.8 (08/27 0932) RPR: NON REAC (01/13 1114)  HBsAg: NEGATIVE (08/27 0932)  HIV: NON REACTIVE (08/27 0932)  GBS: NEGATIVE (03/10 1437)  Pap neg 8/'13  Assessment/Plan: 1. [redacted]w[redacted]d 2. Early active labor 3. GBS neg 4. Cat I FHT  1. Admit to [facility] w/ dr. Pennie Rushing as attending 2. Routine L&D orders 3. Epidural prn 4. Support as needed 5. C/w MD prn  Deborah Lazcano H 11/07/2012, 2:22 AM

## 2012-11-07 NOTE — Progress Notes (Signed)
Patient ID: Maria Douglas, female   DOB: [birth year], 27 y.o.   MRN: [id number] Maria Douglas is a 27 y.o. G1P0 at [redacted]w[redacted]d   Subjective: Comfortable w epidural, has felt some pressure but no strong urge to push,  Called to BS by RN due to FHR decels,   Objective: BP 111/72  Pulse 75  Temp(Src) 98.2 F (36.8 C) (Oral)  Resp 18  Ht 5\' 6"  (1.676 m)  Wt 178 lb (80.74 kg)  BMI 28.74 kg/m2  LMP 12/28/2011     FHT:  FHR: 150 bpm, variability: moderate,  accelerations:  Present,  decelerations:  Present variables, early UC:   regular, every 2 minutes SVE:   10/100/0    Assessment / Plan: variables now resolving, will allow for laboring down, and begin pushing w urge  Pitocin now off, had been on 2mu,  Amnioinfusion started   Labor: 2nd stage Preeclampsia:  no signs or symptoms of toxicity Fetal Wellbeing:  Category II Pain Control:  Epidural Anticipated MOD:  NSVD  Recheck in about an hour or PRN to begin pushing   Update physician PRN   [name] 11/07/2012, 2:14 PM

## 2012-11-07 NOTE — Progress Notes (Signed)
Patient ID: Maria Douglas, female   DOB: 1986-06-14, 27 y.o.   MRN: [id number] Maria Douglas is a 27 y.o. G1P0 at [redacted]w[redacted]d   Subjective: Comfortable w epidural   Objective: BP 114/83  Pulse 87  Temp(Src) 98.2 F (36.8 C) (Oral)  Resp 18  Ht 5\' 6"  (1.676 m)  Wt 178 lb (80.74 kg)  BMI 28.74 kg/m2  LMP 12/28/2011     FHT:  FHR: 130 bpm, variability: moderate,  accelerations:  Present,  decelerations:  Present occ variable UC:   Not tracing well SVE:   Dilation: 7.5 Effacement (%): 100 Station: -1 Exam by:: Maria Douglas, CNM  IUPC placed without difficulty    Assessment / Plan: Protracted active phase  Labor: begin pitocin to titrate for adequate MVU's  Preeclampsia:  no signs or symptoms of toxicity Fetal Wellbeing:  Category I Pain Control:  Epidural Anticipated MOD:  NSVD  Recheck in 2 hours of adequate ctx pattern   Dr Maria Douglas rupdated    Maria Douglas 11/07/2012, 11:45 AM

## 2012-11-08 LAB — CBC
HCT: 29.8 % — ABNORMAL LOW (ref 36.0–46.0)
MCH: 33 pg (ref 26.0–34.0)
MCV: 94.6 fL (ref 78.0–100.0)
Platelets: 94 10*3/uL — ABNORMAL LOW (ref 150–400)
RBC: 3.15 MIL/uL — ABNORMAL LOW (ref 3.87–5.11)

## 2012-11-08 NOTE — Progress Notes (Signed)
Post Partum Day 1 Subjective: up ad lib, voiding, tolerating PO, + flatus and VB lighter today.  Newborn in Aunt's arms on my arrival to room.  Nipples Sore at times w/ BF'ng.  No dizziness when up.  Husband asleep at bs.  [facility] [place] [facility].  Objective: Blood pressure 114/59, pulse 70, temperature 98.5 F (36.9 C), temperature source Oral, resp. rate 18, height 5\' 6"  (1.676 m), weight 178 lb (80.74 kg), last menstrual period 12/28/2011, unknown if currently breastfeeding.  Physical Exam:  General: alert, cooperative, no distress and fatigued Lochia: appropriate, rubra, mod Uterine Fundus: firm, below umbilicus Incision: n/a DVT Evaluation: No evidence of DVT seen on physical exam. Negative Homan's sign. No significant calf/ankle edema.   Recent Labs  11/07/12 0241 11/08/12 0725  HGB 13.1 10.4*  HCT 36.1 29.8*    Assessment/Plan: Plan for discharge tomorrow and Lactation consult Continue work on lactation.    LOS: 1 day   Maria Douglas 11/08/2012, 12:58 PM

## 2012-11-08 NOTE — Anesthesia Postprocedure Evaluation (Addendum)
Anesthesia Post Note      Anesthesia type: Epidural  Patient location: Mother/Baby  Post pain: Pain level controlled  Post assessment: Post-op Vital signs reviewed  Last Vitals: BP 114/59  Pulse 70  Temp(Src) 36.9 C (Oral)  Resp 18  Ht 5\' 6"  (1.676 m)  Wt 178 lb (80.74 kg)  BMI 28.74 kg/m2  LMP 12/28/2011  Post vital signs: Reviewed  Level of consciousness: awake  Complications: No apparent anesthesia complications

## 2012-11-09 MED ORDER — IBUPROFEN 600 MG PO TABS: 600.0000 mg | ORAL_TABLET | Freq: Four times a day (QID) | ORAL | Status: DC

## 2012-11-09 NOTE — Discharge Summary (Signed)
  Vaginal Delivery Discharge Summary  Maria Douglas  DOB:    14-Apr-1986 MRN:    [id number] CSN:    [id number]  Date of admission:                  11/07/12  Date of discharge:                   11/09/12  Procedures this admission: SVD with repair of 2nd degree and periurethral lac  Date of Delivery: 11/07/12 by Almond Lint  Newborn Data:  Live born female  Birth Weight: 7 lb 9.7 oz (3450 g) APGAR: 7, 9  Home with mother. Circumcision Plan: Inpatient today  History of Present Illness:  Maria Douglas is a 27 y.o. female, G1P1001, who presents at [redacted]w[redacted]d weeks gestation. The patient has been followed at the [facility] of [facility]. She was admitted onset of labor. Her pregnancy has been complicated by:  Patient Active Problem List  Diagnosis  . PCOS (polycystic ovarian syndrome)  . Benign heart murmur  . Hx of pyelonephritis  . NSVD (normal spontaneous vaginal delivery)  . Second degree perineal laceration  . Laceration of periurethral tissue with delivery  . Lactating mother   Hospital course:  The patient was admitted for active labor.   Her labor was not complicated. She proceeded to have a vaginal delivery of a healthy infant. Her delivery was not complicated. Her postpartum course was not complicated.  She was discharged to home on postpartum day 2 doing well.  Feeding:  breast  Contraception:  condoms, oral contraceptives (estrogen/progesterone) - Encouraged to take Depo before she leaves in order to provide adequate BC until she can use OCP  Discharge hemoglobin:  Hemoglobin  Date Value Range Status  11/08/2012 10.4* 12.0 - 15.0 g/dL Final     DELTA CHECK NOTED     REPEATED TO VERIFY     HCT  Date Value Range Status  11/08/2012 29.8* 36.0 - 46.0 % Final    Discharge Physical Exam:   General: alert and no distress Lochia: appropriate Uterine Fundus: firm Incision: healing well DVT  Evaluation: No evidence of DVT seen on physical exam.  Intrapartum Procedures: spontaneous vaginal delivery Postpartum Procedures: none Complications-Operative and Postpartum: 2nd degree perineal laceration and periurethral lac  Discharge Diagnoses: Term Pregnancy-delivered  Discharge Information:  Activity:           per [facility] handbook Diet:                routine Medications: PNV and Ibuprofen Condition:      stable Instructions:  refer to practice specific booklet Discharge to: home  Follow-up Information   Follow up with [facility]. Schedule an appointment as soon as possible for a visit in 6 weeks. (Call with any questions or concerns)    Contact information:   [address] [place] [address] [phone]       Haroldine Laws 11/09/2012

## 2013-07-04 ENCOUNTER — Ambulatory Visit
Admission: RE | Admit: 2013-07-04 | Discharge: 2013-07-04 | Disposition: A | Discharge status: Home / Self Care | Payer: Managed Care, Other (non HMO) | Source: Ambulatory Visit | Attending: Family Medicine | Admitting: Family Medicine

## 2013-07-04 ENCOUNTER — Other Ambulatory Visit: Payer: Self-pay | Admitting: Family Medicine

## 2013-07-04 DIAGNOSIS — R432 Parageusia: Secondary | ICD-10-CM

## 2013-07-04 DIAGNOSIS — R430 Anosmia: Secondary | ICD-10-CM

## 2013-07-05 ENCOUNTER — Other Ambulatory Visit: Payer: Self-pay | Admitting: Family Medicine

## 2013-07-05 ENCOUNTER — Ambulatory Visit
Admission: RE | Admit: 2013-07-05 | Discharge: 2013-07-05 | Disposition: A | Discharge status: Home / Self Care | Payer: Managed Care, Other (non HMO) | Source: Ambulatory Visit | Attending: Family Medicine | Admitting: Family Medicine

## 2013-07-05 DIAGNOSIS — R432 Parageusia: Secondary | ICD-10-CM

## 2013-07-05 DIAGNOSIS — R430 Anosmia: Secondary | ICD-10-CM

## 2014-03-20 LAB — OB RESULTS CONSOLE ANTIBODY SCREEN: Antibody Screen: NEGATIVE

## 2014-03-20 LAB — OB RESULTS CONSOLE GC/CHLAMYDIA
CHLAMYDIA, DNA PROBE: NEGATIVE
Gonorrhea: NEGATIVE

## 2014-03-20 LAB — OB RESULTS CONSOLE ABO/RH: RH Type: POSITIVE

## 2014-03-20 LAB — OB RESULTS CONSOLE HEPATITIS B SURFACE ANTIGEN: HEP B S AG: NEGATIVE

## 2014-03-20 LAB — OB RESULTS CONSOLE RUBELLA ANTIBODY, IGM: RUBELLA: IMMUNE

## 2014-03-20 LAB — OB RESULTS CONSOLE HIV ANTIBODY (ROUTINE TESTING): HIV: NONREACTIVE

## 2014-03-20 LAB — OB RESULTS CONSOLE RPR: RPR: NONREACTIVE

## 2014-04-24 LAB — OB RESULTS CONSOLE GBS: GBS: POSITIVE

## 2014-05-29 ENCOUNTER — Encounter (HOSPITAL_COMMUNITY): Payer: Self-pay

## 2014-07-28 NOTE — L&D Delivery Note (Signed)
Vaginal Delivery Note The pt utilized an epidural as pain management.   Artificial rupture of membranes today, at clear, clear.  GBS was positive, PCN x 2 doses were given.  Cervical dilation was complete at  0626.  NICHD Category 1.    Pushing with guidance began at  0725.   After 39 minutes of pushing the head, shoulders and the body of a viable female infant "Franky MachoLuke" delivered spontaneously with maternal effort in the ROA position at 0804.   With vigorous tone and spontaneous cry, the infant was placed on moms abd.  After the umbilical cord was clamped it was cut by the FOB, then cord blood was obtained for evaluation.  Spontaneous delivery of a intact placenta with a 3 vessel cord via [place] at  [phone].   Episiotomy: None   The vulva, perineum, vaginal vault, rectum and cervix were inspected and revealed a right labial abriasion, hemostatic, not repaired and at pin point gusher repaired with two stitchs using a 4-0 vicryl on a SH.  Lidocaine was not used, the epidural was sufficient for the repair.    Postpartum pitocin as ordered.  Fundus firm, lochia minimum, bleeding under control. EBL 50, Pt hemodynamically stable.   Sponge, laps and needle count correct and verified with the primary care nurse.  Attending MD available at all times.    Mom and baby were left in stable condition, baby skin to skin. Routine postpartum orders   Mother desires OCPs for contraception.   Infant to have in patient circumcision.    Placenta to pathology: NO     Cord Gases sent to lab: NO Cord blood sent to lab: YES   APGARS:  9 at 1 minute and 9 at 5 minutes Weight:. pending     Both mom and baby were left in stable condition      Kayo Zion, CNM, MSN 11/23/2014. 8:56 AM

## 2014-09-22 ENCOUNTER — Inpatient Hospital Stay (HOSPITAL_COMMUNITY): Admission: AD | Admit: 2014-09-22 | Payer: Self-pay | Source: Ambulatory Visit | Admitting: Obstetrics and Gynecology

## 2014-11-22 ENCOUNTER — Inpatient Hospital Stay (HOSPITAL_COMMUNITY): Payer: BLUE CROSS/BLUE SHIELD | Admitting: Anesthesiology

## 2014-11-22 ENCOUNTER — Encounter (HOSPITAL_COMMUNITY): Payer: Self-pay | Admitting: *Deleted

## 2014-11-22 ENCOUNTER — Inpatient Hospital Stay (HOSPITAL_COMMUNITY)
Admission: AD | Admit: 2014-11-22 | Discharge: 2014-11-25 | DRG: 775 | Disposition: A | Discharge status: Home / Self Care | Payer: BLUE CROSS/BLUE SHIELD | Source: Ambulatory Visit | Attending: Obstetrics & Gynecology | Admitting: Obstetrics & Gynecology

## 2014-11-22 DIAGNOSIS — Z3A40 40 weeks gestation of pregnancy: Secondary | ICD-10-CM | POA: Diagnosis not present

## 2014-11-22 DIAGNOSIS — Z87448 Personal history of other diseases of urinary system: Secondary | ICD-10-CM | POA: Insufficient documentation

## 2014-11-22 DIAGNOSIS — D696 Thrombocytopenia, unspecified: Secondary | ICD-10-CM | POA: Diagnosis present

## 2014-11-22 DIAGNOSIS — O99119 Other diseases of the blood and blood-forming organs and certain disorders involving the immune mechanism complicating pregnancy, unspecified trimester: Secondary | ICD-10-CM

## 2014-11-22 DIAGNOSIS — E282 Polycystic ovarian syndrome: Secondary | ICD-10-CM | POA: Insufficient documentation

## 2014-11-22 DIAGNOSIS — R010 Benign and innocent cardiac murmurs: Secondary | ICD-10-CM | POA: Insufficient documentation

## 2014-11-22 DIAGNOSIS — O9912 Other diseases of the blood and blood-forming organs and certain disorders involving the immune mechanism complicating childbirth: Secondary | ICD-10-CM | POA: Diagnosis present

## 2014-11-22 DIAGNOSIS — Z6829 Body mass index (BMI) 29.0-29.9, adult: Secondary | ICD-10-CM

## 2014-11-22 DIAGNOSIS — O480 Post-term pregnancy: Secondary | ICD-10-CM | POA: Diagnosis present

## 2014-11-22 DIAGNOSIS — O99824 Streptococcus B carrier state complicating childbirth: Secondary | ICD-10-CM | POA: Diagnosis present

## 2014-11-22 DIAGNOSIS — Z8659 Personal history of other mental and behavioral disorders: Secondary | ICD-10-CM

## 2014-11-22 DIAGNOSIS — O99820 Streptococcus B carrier state complicating pregnancy: Secondary | ICD-10-CM

## 2014-11-22 LAB — CBC
HCT: 34.7 % — ABNORMAL LOW (ref 36.0–46.0)
Hemoglobin: 12.5 g/dL (ref 12.0–15.0)
MCH: 33 pg (ref 26.0–34.0)
MCHC: 36 g/dL (ref 30.0–36.0)
MCV: 91.6 fL (ref 78.0–100.0)
PLATELETS: 131 10*3/uL — AB (ref 150–400)
RBC: 3.79 MIL/uL — ABNORMAL LOW (ref 3.87–5.11)
RDW: 12.4 % (ref 11.5–15.5)
WBC: 7.9 10*3/uL (ref 4.0–10.5)

## 2014-11-22 MED ORDER — LACTATED RINGERS IV SOLN
INTRAVENOUS | Status: DC
  Administered 2014-11-23: 07:00:00 via INTRAVENOUS

## 2014-11-22 MED ORDER — NALBUPHINE HCL 10 MG/ML IJ SOLN: 5.0000 mg | INTRAMUSCULAR | Status: DC | PRN

## 2014-11-22 MED ORDER — EPHEDRINE 5 MG/ML INJ
10.0000 mg | INTRAVENOUS | Status: DC | PRN
  Filled 2014-11-22: qty 2

## 2014-11-22 MED ORDER — PHENYLEPHRINE 40 MCG/ML (10ML) SYRINGE FOR IV PUSH (FOR BLOOD PRESSURE SUPPORT)
80.0000 ug | PREFILLED_SYRINGE | INTRAVENOUS | Status: DC | PRN
  Filled 2014-11-22: qty 20
  Filled 2014-11-22: qty 2

## 2014-11-22 MED ORDER — FLEET ENEMA 7-19 GM/118ML RE ENEM: 1.0000 | ENEMA | RECTAL | Status: DC | PRN

## 2014-11-22 MED ORDER — OXYCODONE-ACETAMINOPHEN 5-325 MG PO TABS: 1.0000 | ORAL_TABLET | ORAL | Status: DC | PRN

## 2014-11-22 MED ORDER — OXYTOCIN BOLUS FROM INFUSION: 500.0000 mL | INTRAVENOUS | Status: DC

## 2014-11-22 MED ORDER — OXYCODONE-ACETAMINOPHEN 5-325 MG PO TABS: 2.0000 | ORAL_TABLET | ORAL | Status: DC | PRN

## 2014-11-22 MED ORDER — OXYTOCIN 40 UNITS IN LACTATED RINGERS INFUSION - SIMPLE MED
62.5000 mL/h | INTRAVENOUS | Status: DC
  Filled 2014-11-22: qty 1000

## 2014-11-22 MED ORDER — NALBUPHINE HCL 10 MG/ML IJ SOLN: 10.0000 mg | INTRAMUSCULAR | Status: DC | PRN

## 2014-11-22 MED ORDER — FENTANYL 2.5 MCG/ML BUPIVACAINE 1/10 % EPIDURAL INFUSION (WH - ANES)
14.0000 mL/h | INTRAMUSCULAR | Status: DC | PRN
  Administered 2014-11-23 (×2): 14 mL/h via EPIDURAL
  Filled 2014-11-22 (×2): qty 125

## 2014-11-22 MED ORDER — LACTATED RINGERS IV SOLN
500.0000 mL | Freq: Once | INTRAVENOUS | Status: AC
  Administered 2014-11-22: 500 mL via INTRAVENOUS

## 2014-11-22 MED ORDER — LIDOCAINE HCL (PF) 1 % IJ SOLN
30.0000 mL | INTRAMUSCULAR | Status: DC | PRN
  Filled 2014-11-22: qty 30

## 2014-11-22 MED ORDER — PHENYLEPHRINE 40 MCG/ML (10ML) SYRINGE FOR IV PUSH (FOR BLOOD PRESSURE SUPPORT)
80.0000 ug | PREFILLED_SYRINGE | INTRAVENOUS | Status: DC | PRN
  Filled 2014-11-22: qty 2

## 2014-11-22 MED ORDER — ONDANSETRON HCL 4 MG/2ML IJ SOLN
4.0000 mg | Freq: Four times a day (QID) | INTRAMUSCULAR | Status: DC | PRN
  Administered 2014-11-23: 4 mg via INTRAVENOUS
  Filled 2014-11-22: qty 2

## 2014-11-22 MED ORDER — ACETAMINOPHEN 325 MG PO TABS: 650.0000 mg | ORAL_TABLET | ORAL | Status: DC | PRN

## 2014-11-22 MED ORDER — CITRIC ACID-SODIUM CITRATE 334-500 MG/5ML PO SOLN: 30.0000 mL | ORAL | Status: DC | PRN

## 2014-11-22 MED ORDER — SODIUM CHLORIDE 0.9 % IV SOLN
2.0000 g | Freq: Once | INTRAVENOUS | Status: AC
  Administered 2014-11-23: 2 g via INTRAVENOUS
  Filled 2014-11-22: qty 2000

## 2014-11-22 MED ORDER — LACTATED RINGERS IV SOLN
500.0000 mL | INTRAVENOUS | Status: DC | PRN
  Administered 2014-11-23: 500 mL via INTRAVENOUS

## 2014-11-22 MED ORDER — DIPHENHYDRAMINE HCL 50 MG/ML IJ SOLN: 12.5000 mg | INTRAMUSCULAR | Status: DC | PRN

## 2014-11-22 NOTE — MAU Note (Signed)
Pt presents with complaint of contractions, denies bleeding or ROM 

## 2014-11-22 NOTE — MAU Note (Signed)
Correction to IV charting: pt tolerated well.

## 2014-11-22 NOTE — MAU Note (Signed)
Pt states that she began contracting this morning and have gotten stronger.

## 2014-11-22 NOTE — Anesthesia Preprocedure Evaluation (Signed)

## 2014-11-22 NOTE — H&P (Signed)
Maria Douglas is a 29 y.o. female, G2P1001 at 40.6 weeks, presenting in early/active labor. Reports +FM and ctxs off and on all day. Denies LOF or VB.  Patient Active Problem List   Diagnosis Date Noted  . GBS (group B Streptococcus carrier), +RV culture, currently pregnant 11/23/2014  . History of depression - age 69, no meds 11/23/2014  . BMI 29.0-29.9,adult 11/23/2014  . Gestational thrombocytopenia 11/23/2014  . PCOS (polycystic ovarian syndrome) 11/22/2014  . Benign heart murmur 11/22/2014  . Hx of pyelonephritis 11/22/2014  . Normal labor 11/22/2014   History of present pregnancy: Patient entered care at 10.4 weeks.   EDC of 11/16/14 was established by sure LMP and that was c/w 10.6 wk sono.   Anatomy scan: 19 weeks, with normal findings and a fundal placenta.   Additional [place] evaluations:  40.6 wks due to approaching late term pregnancy: BPP 8/8, AFI 19.0 cm (90th%).   Significant prenatal events:  Maternal h/o heart murmur - had fetal echo at 22.5 wks (normal per pt report); GBS bacteriuria - Rx'd med, but pt did not take; UTI - treated; vaginal pressure in late 2nd through most of 3rd trimester - no significant findings; intermittent increased maternal pulse in 3rd trimester w/o SOB, CP or lightheadedness - no w/u required; TWG 28 lbs. No MAU visits. Last evaluation: Office 11/22/14 by LC, NP @ 40.6 wks. FHR 150, cvx 3/50/-2, BP 104/66.  OB History    Gravida Para Term Preterm AB TAB SAB Ectopic Multiple Living   SVD on 11/07/2012 @ 41.2 wks, female infant, birthwt 7+9, epidural, WHG, 2nd degree laceration and laceration of periurethral tissue. Past Medical History  Diagnosis Date  . PCOS (polycystic ovarian syndrome) 2012  . Hx: UTI (urinary tract infection) 2012  . Heart murmur     HAD LONG TIME; WAS ADVISED PROPHYLAXSIS FOR DENTIST  . Depression 2005    NO MEDS  . Anemia 2010    NO MEDS  . Headache(784.0)     OCC   Past Surgical History  Procedure  Laterality Date  . Wisdom tooth extraction     Family History: family history includes Anemia in her paternal grandfather; Cancer in her maternal grandfather and paternal grandfather; Depression in her mother; Down syndrome in her paternal uncle; Early death (age of onset: 55) in her maternal grandmother; Heart disease in her maternal grandfather, mother, and paternal grandfather; Hypertension in her paternal grandmother; Migraines in her mother; Mitral valve prolapse in her mother; Other in her mother; Stroke in her maternal grandfather and paternal uncle. Social History:  reports that she has never smoked. She has never used smokeless tobacco. She reports that she drinks about 2.0 - 2.5 oz of alcohol per week. She reports that she does not use illicit drugs. Pt is Caucasian with a 4-year college degree, a homemaker, and married to Carlton who is present and supportive. She has a non-denominational religious preference and will accept blood products in an emergency. Declined flu vaccine and Tdap.  Prenatal Transfer Tool  Maternal Diabetes: No Genetic Screening: Declined Maternal Ultrasounds/Referrals: Normal Fetal Ultrasounds or other Referrals:  Fetal echo due to maternal hx of heart murmur Maternal Substance Abuse:  No Significant Maternal Medications:  Meds include: Other: PNVs Significant Maternal Lab Results: Lab values include: Group B Strep positive  TDAP: Declined Flu: Declined  ROS: As noted in HPI  Allergies  Allergen Reactions  . Minocycline  Swelling    Itching     Dilation: 5 Effacement (%): 90 Station: -2 Exam by:: K Toshua Honsinger CNM Blood pressure 106/70, pulse 72, temperature 98.1 F (36.7 C), temperature source Oral, resp. rate 18, height 5\' 6"  (1.676 m), weight 173 lb (78.472 kg), SpO2 99 %, currently breastfeeding.  Chest clear Heart RRR without murmur Abd gravid, NT, FH CWD Pelvic: As above, intact BOW Cephalic by Leopolds and VE EFW: 7 1/2 lbs Ext: WNL  FHR:  BL 140 w/ moderate variability, +accels, no decels UCs: q 3-6 min   Prenatal labs: ABO, Rh: --/--/A POS (04/27 2330) Antibody: NEG (04/27 2330)Neg on 03/20/14 Rubella: Immune RPR: Nonreactive (08/24 0000)  HBsAg: Negative (08/24 0000)  HIV: Non-reactive (08/24 0000)  GBS: Positive Sickle cell/Hgb electrophoresis: NA Pap: ASCUS on 11/28/2013; neg HPV GC: Neg on 03/20/14 Chlamydia: Neg on 03/20/14 Genetic screenings: Declined Glucola: Normal at 115  Hgb 12.3 at NOB, 11.6 at 28 weeks Results for orders placed or performed during the hospital encounter of 11/22/14 (from the past 24 hour(s))  CBC     Status: Abnormal   Collection Time: 11/22/14 11:30 PM  Result Value Ref Range   WBC 7.9 4.0 - 10.5 K/uL   RBC 3.79 (L) 3.87 - 5.11 MIL/uL   Hemoglobin 12.5 12.0 - 15.0 g/dL   HCT 16.134.7 (L) 09.636.0 - 04.546.0 %   MCV 91.6 78.0 - 100.0 fL   MCH 33.0 26.0 - 34.0 pg   MCHC 36.0 30.0 - 36.0 g/dL   RDW 40.912.4 81.111.5 - 91.415.5 %   Platelets 131 (L) 150 - 400 K/uL  Type and screen     Status: None   Collection Time: 11/22/14 11:30 PM  Result Value Ref Range   ABO/RH(D) A POS    Antibody Screen NEG    Sample Expiration 11/25/2014    Assessment: IUP at 40.6 wks Approaching active labor GBS positive BMI 29.5 Cat 1 FHRT Hx of depression as a teen - no issues/meds currently Gestational thrombocytopenia noted on admission labs  [facility]: [facility] Pain med/epidural as desired Ampicillin for GBS positive status Expectant management for now Anticipate progress and SVD  Sherre ScarletWILLIAMS, Rawson Minix CNM, MS 11/22/14, 11:35 PM

## 2014-11-23 ENCOUNTER — Encounter (HOSPITAL_COMMUNITY): Payer: Self-pay | Admitting: *Deleted

## 2014-11-23 DIAGNOSIS — Z8659 Personal history of other mental and behavioral disorders: Secondary | ICD-10-CM

## 2014-11-23 DIAGNOSIS — Z6829 Body mass index (BMI) 29.0-29.9, adult: Secondary | ICD-10-CM

## 2014-11-23 DIAGNOSIS — O99119 Other diseases of the blood and blood-forming organs and certain disorders involving the immune mechanism complicating pregnancy, unspecified trimester: Secondary | ICD-10-CM

## 2014-11-23 DIAGNOSIS — O99820 Streptococcus B carrier state complicating pregnancy: Secondary | ICD-10-CM

## 2014-11-23 DIAGNOSIS — D696 Thrombocytopenia, unspecified: Secondary | ICD-10-CM

## 2014-11-23 LAB — RPR: RPR Ser Ql: NONREACTIVE

## 2014-11-23 LAB — TYPE AND SCREEN
ABO/RH(D): A POS
Antibody Screen: NEGATIVE

## 2014-11-23 LAB — HIV ANTIBODY (ROUTINE TESTING W REFLEX): HIV Screen 4th Generation wRfx: NONREACTIVE

## 2014-11-23 LAB — ABO/RH: ABO/RH(D): A POS

## 2014-11-23 MED ORDER — ZOLPIDEM TARTRATE 5 MG PO TABS: 5.0000 mg | ORAL_TABLET | Freq: Every evening | ORAL | Status: DC | PRN

## 2014-11-23 MED ORDER — AMPICILLIN SODIUM 2 G IJ SOLR
2.0000 g | Freq: Four times a day (QID) | INTRAMUSCULAR | Status: DC
  Administered 2014-11-23: 2 g via INTRAVENOUS
  Filled 2014-11-23 (×2): qty 2000

## 2014-11-23 MED ORDER — TERBUTALINE SULFATE 1 MG/ML IJ SOLN
0.2500 mg | Freq: Once | INTRAMUSCULAR | Status: DC | PRN
  Filled 2014-11-23: qty 1

## 2014-11-23 MED ORDER — ACETAMINOPHEN 325 MG PO TABS: 650.0000 mg | ORAL_TABLET | ORAL | Status: DC | PRN

## 2014-11-23 MED ORDER — DIBUCAINE 1 % RE OINT
1.0000 "application " | TOPICAL_OINTMENT | RECTAL | Status: DC | PRN
  Administered 2014-11-24: 1 via RECTAL
  Filled 2014-11-23: qty 28

## 2014-11-23 MED ORDER — OXYCODONE-ACETAMINOPHEN 5-325 MG PO TABS: 2.0000 | ORAL_TABLET | ORAL | Status: DC | PRN

## 2014-11-23 MED ORDER — SIMETHICONE 80 MG PO CHEW: 80.0000 mg | CHEWABLE_TABLET | ORAL | Status: DC | PRN

## 2014-11-23 MED ORDER — ONDANSETRON HCL 4 MG/2ML IJ SOLN: 4.0000 mg | INTRAMUSCULAR | Status: DC | PRN

## 2014-11-23 MED ORDER — TETANUS-DIPHTH-ACELL PERTUSSIS 5-2.5-18.5 LF-MCG/0.5 IM SUSP
0.5000 mL | Freq: Once | INTRAMUSCULAR | Status: DC
Start: 2014-11-24 — End: 2014-11-25

## 2014-11-23 MED ORDER — LANOLIN HYDROUS EX OINT
TOPICAL_OINTMENT | CUTANEOUS | Status: DC | PRN
Start: 2014-11-23 — End: 2014-11-25

## 2014-11-23 MED ORDER — IBUPROFEN 600 MG PO TABS
600.0000 mg | ORAL_TABLET | Freq: Four times a day (QID) | ORAL | Status: DC
  Administered 2014-11-23 – 2014-11-25 (×8): 600 mg via ORAL
  Filled 2014-11-23 (×8): qty 1

## 2014-11-23 MED ORDER — BENZOCAINE-MENTHOL 20-0.5 % EX AERO
1.0000 "application " | INHALATION_SPRAY | CUTANEOUS | Status: DC | PRN
  Filled 2014-11-23: qty 56

## 2014-11-23 MED ORDER — OXYTOCIN 40 UNITS IN LACTATED RINGERS INFUSION - SIMPLE MED
1.0000 m[IU]/min | INTRAVENOUS | Status: DC
  Administered 2014-11-23: 1 m[IU]/min via INTRAVENOUS

## 2014-11-23 MED ORDER — WITCH HAZEL-GLYCERIN EX PADS
1.0000 "application " | MEDICATED_PAD | CUTANEOUS | Status: DC | PRN
  Administered 2014-11-24: 1 via TOPICAL

## 2014-11-23 MED ORDER — OXYCODONE-ACETAMINOPHEN 5-325 MG PO TABS: 1.0000 | ORAL_TABLET | ORAL | Status: DC | PRN

## 2014-11-23 MED ORDER — DIPHENHYDRAMINE HCL 25 MG PO CAPS: 25.0000 mg | ORAL_CAPSULE | Freq: Four times a day (QID) | ORAL | Status: DC | PRN

## 2014-11-23 MED ORDER — SENNOSIDES-DOCUSATE SODIUM 8.6-50 MG PO TABS
2.0000 | ORAL_TABLET | ORAL | Status: DC
  Administered 2014-11-23 – 2014-11-25 (×2): 2 via ORAL
  Filled 2014-11-23 (×2): qty 2

## 2014-11-23 MED ORDER — PRENATAL MULTIVITAMIN CH
1.0000 | ORAL_TABLET | Freq: Every day | ORAL | Status: DC
  Administered 2014-11-24: 1 via ORAL
  Filled 2014-11-23: qty 1

## 2014-11-23 MED ORDER — ONDANSETRON HCL 4 MG PO TABS: 4.0000 mg | ORAL_TABLET | ORAL | Status: DC | PRN

## 2014-11-23 MED ORDER — LIDOCAINE HCL (PF) 1 % IJ SOLN
INTRAMUSCULAR | Status: DC | PRN
  Administered 2014-11-23: 6 mL
  Administered 2014-11-23: 4 mL

## 2014-11-23 NOTE — Progress Notes (Addendum)
  Subjective: Somewhat comfortable w/ epidural (placed at 00:11 AM). Can still feel ctxs, but bearable and nothing like before. Family at bedside. Feeling dizzy. Last ate at 7 pm yesterday. Sipping on apple juice which seems to help. Denies SOB, difficulty breathing, CP, ROM or VB. +FM.  Objective: BP 104/61 mmHg  Pulse 111  Temp(Src) 98.1 F (36.7 C) (Oral)  Resp 18  Ht 5\' 6"  (1.676 m)  Wt 173 lb (78.472 kg)  BMI 27.94 kg/m2  SpO2 99%     Today's Vitals   11/23/14 0025 11/23/14 0030 11/23/14 0035 11/23/14 0118  BP: 102/75 104/58 104/61   Pulse: 77 124 111   Temp:      TempSrc:      Resp: 18 18 18    Height:      Weight:      SpO2: 99% 99% 99%   PainSc:   0-No pain 3    FHT: Category 1 UC:   irregular, every 3-6 minutes SVE: Deferred    Received first ATB dose at 00:15 AM  Assessment:  Maternal tachycardia, max pulse 124 bpm Likely dehydration  Plan: Place foley catheter 500 ml LR bolus x 1 now Epidural bolus prn Re-evaluate cvx in 1 hr, sooner if indicated for augmentation. Plan reviewed w/ pt who concurs.   Mayford KnifeWILLIAMS, Katlen Seyer CNM 11/23/2014, 1:20 AM

## 2014-11-23 NOTE — Progress Notes (Addendum)
  Subjective: Feeling better s/p IVFs. Received epidural bolus w/ relief. +Shaking.  Objective: BP 115/94 mmHg  Pulse 95  Temp(Src) 97.9 F (36.6 C) (Oral)  Resp 18  Ht 5\' 6"  (1.676 m)  Wt 173 lb (78.472 kg)  BMI 27.94 kg/m2  SpO2 99%      Today's Vitals   11/23/14 0118 11/23/14 0130 11/23/14 0200 11/23/14 0230  BP:  106/70 104/68 115/94  Pulse:  72 70 95  Temp:    97.9 F (36.6 C)  TempSrc:    Oral  Resp:  18 18 18   Height:      Weight:      SpO2:      PainSc: 3  Asleep  0-No pain   FHT: BL 122 w/ moderate variability, +accels, occ early, occ variable UC:   irregular, every 5-7 minutes SVE:   Dilation: 5 Effacement (%): 90 Station: -2 Exam by:: K Collie Kittel CNM Cvx stretchy AROM'd, small amount of clear fluid IUPC placed w/o difficulty  Assessment:  IUP at 41.0 wks GBS positive Minimal cervical change   Plan: R/B of Pitocin augmentation reviewed w/ pt. In agreement with starting low dose Consult prn Anticipate SVD  Maria Douglas, Maria Douglas CNM 11/23/2014, 2:46 AM

## 2014-11-23 NOTE — Lactation Note (Signed)
This note was copied from the chart of Maria Corwin LevinsLauren Didio. Lactation Consultation Note  Initial visit made.  Breastfeeding consultation services and support information given and reviewed with patient.  Baby currently at breast nursing actively.  Reviewed with mom to feed with any feeding cue and to call with concerns/assist. Patient Name: Maria Corwin LevinsLauren Chui Today's Date: 11/23/2014 Reason for consult: Initial assessment   Maternal Data Does the patient have breastfeeding experience prior to this delivery?: Yes  Feeding Feeding Type: Breast Fed Length of feed: 20 min  LATCH Score/Interventions Latch: Grasps breast easily, tongue down, lips flanged, rhythmical sucking.  Audible Swallowing: A few with stimulation Intervention(s): Alternate breast massage  Type of Nipple: Everted at rest and after stimulation  Comfort (Breast/Nipple): Soft / non-tender     Hold (Positioning): No assistance needed to correctly position infant at breast.  LATCH Score: 9  Lactation Tools Discussed/Used     Consult Status Consult Status: Follow-up Date: 11/24/14 Follow-up type: In-patient    Huston FoleyMOULDEN, Cale Bethard S 11/23/2014, 3:04 PM

## 2014-11-23 NOTE — Progress Notes (Signed)
MOB was referred for history of depression/anxiety.  Referral is screened out by Clinical Social Worker because none of the following criteria appear to apply: -History of anxiety/depression during this pregnancy, or of post-partum depression. - Diagnosis of anxiety and/or depression within last 3 years (per chart review, diagnosed at 6217) or -MOB's symptoms are currently being treated with medication and/or therapy.  Please contact the Clinical Social Worker if needs arise or upon MOB request.

## 2014-11-23 NOTE — Anesthesia Postprocedure Evaluation (Signed)
Anesthesia Post Note  Patient: Maria Douglas  Procedure(s) Performed: * No procedures listed *  Anesthesia type: Epidural  Patient location: Mother/Baby  Post pain: Pain level controlled  Post assessment: Post-op Vital signs reviewed  Last Vitals:  Filed Vitals:   11/23/14 1005  BP: 106/76  Pulse: 57  Temp: 36.7 C  Resp: 18    Post vital signs: Reviewed  Level of consciousness:alert  Complications: No apparent anesthesia complications

## 2014-11-23 NOTE — Anesthesia Procedure Notes (Signed)

## 2014-11-24 LAB — CBC
HEMATOCRIT: 31.2 % — AB (ref 36.0–46.0)
HEMOGLOBIN: 10.9 g/dL — AB (ref 12.0–15.0)
MCH: 32.5 pg (ref 26.0–34.0)
MCHC: 34.9 g/dL (ref 30.0–36.0)
MCV: 93.1 fL (ref 78.0–100.0)
Platelets: 110 10*3/uL — ABNORMAL LOW (ref 150–400)
RBC: 3.35 MIL/uL — ABNORMAL LOW (ref 3.87–5.11)
RDW: 12.6 % (ref 11.5–15.5)
WBC: 8.9 10*3/uL (ref 4.0–10.5)

## 2014-11-24 NOTE — Progress Notes (Signed)
Subjective: Postpartum Day 1: Vaginal delivery, right labial laceration  Patient up ad lib, reports no syncope or dizziness. Feeding:  Breast Contraceptive plan:  Micronor  Baby for inpatient circumcision, but has not voided yet. GBS positive  Objective: Vital signs in last 24 hours: Temp:  [97.8 F (36.6 C)-98.3 F (36.8 C)] 97.8 F (36.6 C) (04/29 0650) Pulse Rate:  [57-68] 68 (04/29 0650) Resp:  [16-18] 16 (04/29 0650) BP: (105-107)/(60-76) 105/75 mmHg (04/29 0650) SpO2:  [98 %] 98 % (04/29 0650)  Physical Exam:  General: alert Lochia: appropriate Uterine Fundus: firm Perineum: healing well DVT Evaluation: No evidence of DVT seen on physical exam. Negative Homan's sign.  CBC Latest Ref Rng 11/24/2014 11/22/2014 11/08/2012  WBC 4.0 - 10.5 K/uL 8.9 7.9 13.4(H)  Hemoglobin 12.0 - 15.0 g/dL 10.9(L) 12.5 10.4(L)  Hematocrit 36.0 - 46.0 % 31.2(L) 34.7(L) 29.8(L)  Platelets 150 - 400 K/uL 110(L) 131(L) 94(L)     Assessment/Plan: Status post vaginal delivery day 1. Stable Continue current care. Plan for discharge tomorrow    Nyra CapesLATHAM, VICKICNM 11/24/2014, 10:01 AM

## 2014-11-25 MED ORDER — NORETHINDRONE 0.35 MG PO TABS: 1.0000 | ORAL_TABLET | Freq: Every day | ORAL | Status: DC

## 2014-11-25 NOTE — Discharge Instructions (Signed)

## 2014-11-25 NOTE — Discharge Summary (Signed)
  Vaginal Delivery Discharge Summary  Maria Douglas  DOB:    11/19/1985 MRN:    [id number] CSN:    [id number]  Date of admission:                  11/22/14  Date of discharge:                   11/25/14  Procedures this admission:   SVB, repair of right labial abrasion  Date of Delivery: 11/22/14  Newborn Data:  Live born female  Birth Weight: 8 lb 3.2 oz (3718 g) APGAR: 9, 9  Home with mother. Name: Franky MachoLuke Circumcision Plan: Inpatient  History of Present Illness:  Ms. Maria LombardLauren B Harriger is a 29 y.o. female, G2P2002, who presents at 652w0d weeks gestation. [facility]. She was admitted onset of labor. Her pregnancy has been complicated by:  Patient Active Problem List   Diagnosis Date Noted  . GBS (group B Streptococcus carrier), +RV culture, currently pregnant 11/23/2014  . History of depression - age 29, no meds 11/23/2014  . BMI 29.0-29.9,adult 11/23/2014  . Gestational thrombocytopenia 11/23/2014  . PCOS (polycystic ovarian syndrome) 11/22/2014  . Benign heart murmur 11/22/2014  . Hx of pyelonephritis 11/22/2014  . Normal labor 11/22/2014     Hospital Course:  Admitted 11/22/14 in active labor.  Positive GBS. Progressed rapidly. Utilized epidural for pain management.  Delivery was performed by [facility], CNM, without complication. Patient and baby tolerated the procedure without difficulty, with right labial abrasion noted. Infant status was stable and remained in room with mother.  Mother and infant then had an uncomplicated postpartum course, with breast feeding going well. Mom's physical exam was WNL, and she was discharged home in stable condition. Contraception plan was Micronor .  She received adequate benefit from po pain medications, using only Motrin, did not desire Rx.   Feeding:  breast  Contraception:  oral progesterone-only  contraceptive  Hemoglobin Results:  @CBC3   Discharge Physical Exam:   General: alert Lochia: appropriate Uterine Fundus: firm Incision: healing well DVT Evaluation: No evidence of DVT seen on physical exam. Negative Homan's sign.  Intrapartum Procedures: spontaneous vaginal delivery Postpartum Procedures: none Complications-Operative and Postpartum: labial abrasion  Discharge Diagnoses: Term Pregnancy-delivered  Discharge Information:  Activity:           pelvic rest Diet:                routine Medications: None Condition:      stable Instructions:     Discharge to: home  Follow-up Information    Follow up with [facility] In 6 weeks.   Specialty:  Obstetrics and Gynecology   Why:  Call for any questions or concerns.   Contact information:   [address] [place] [id number] [phone]       Nigel BridgemanLATHAM, Sahvanna Mcmanigal CNM 11/25/2014 10:01 AM

## 2015-02-28 IMAGING — CT CT HEAD W/O CM
4 of 6 series · 17 of 47 positions shown, 19 images · non-contrast
Comparison: None.

CLINICAL DATA: Frontal headaches.  Loss of taste and smell.

EXAM:
CT HEAD WITHOUT CONTRAST
CT MAXILLOFACIAL WITHOUT CONTRAST
TECHNIQUE: Multidetector CT imaging of the head and maxillofacial structures
were performed using the standard protocol without intravenous
contrast. Multiplanar CT image reconstructions of the maxillofacial
structures were also generated.

[Series 3: ax soft · axial · 0.33mm/px · z∈[-48,+29]mm · 7 of 43 slices shown, 9 images]
[im 6/43  brain]
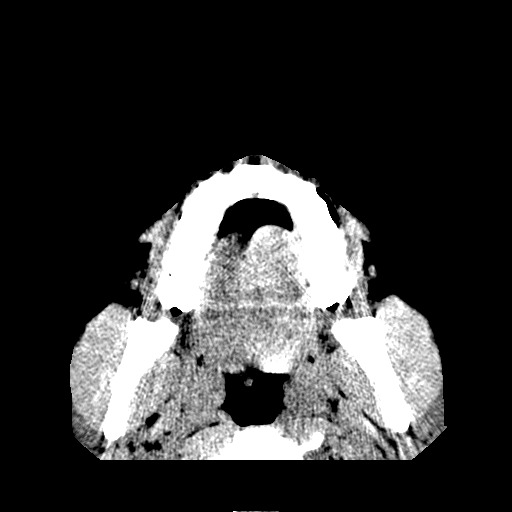
[im 6/43  bone]
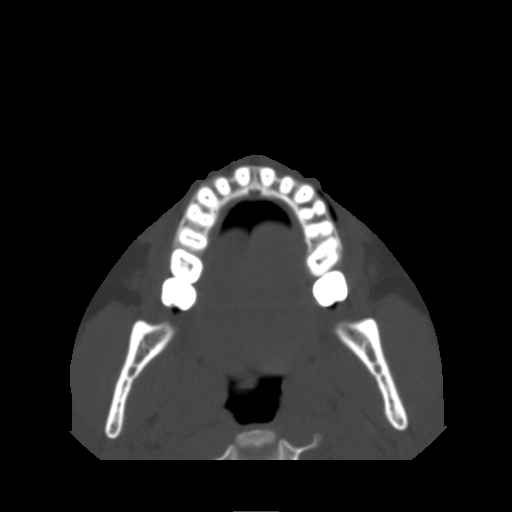
[im 11/43  brain]
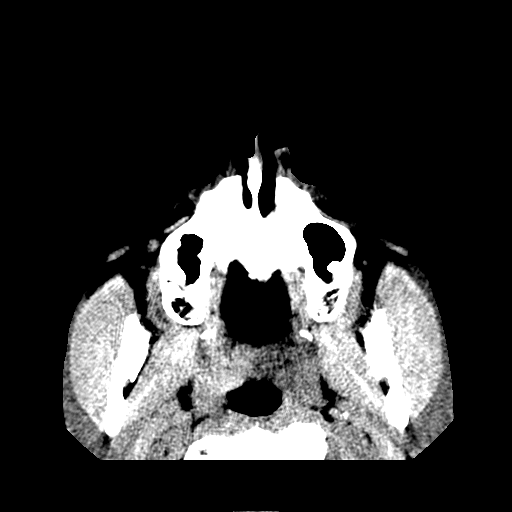
[im 16/43  brain]
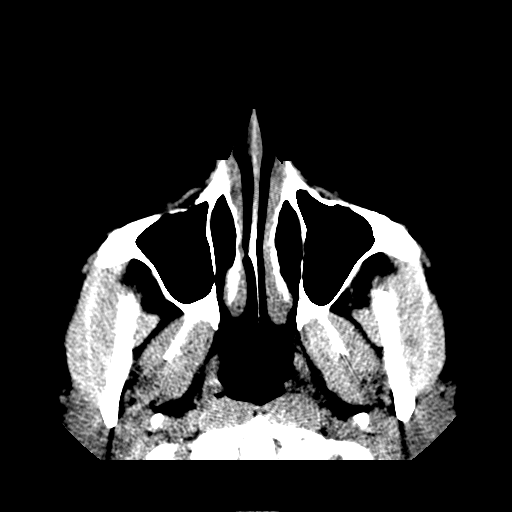
[im 22/43  brain]
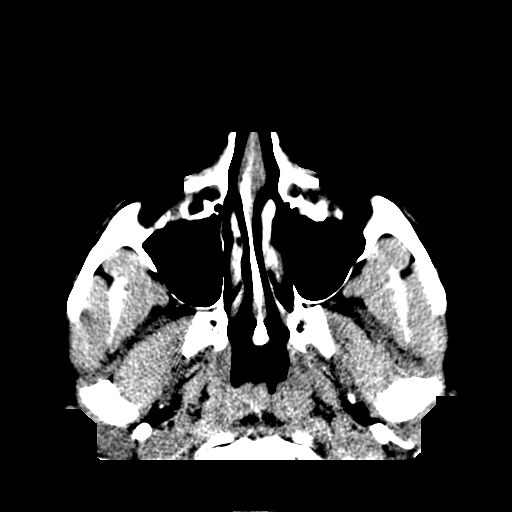
[im 27/43  brain]
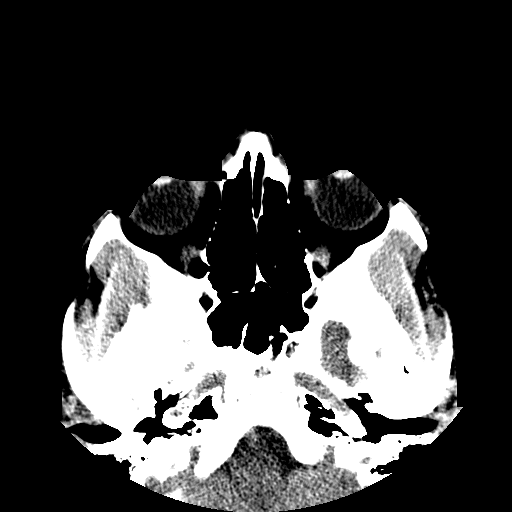
[im 27/43  bone]
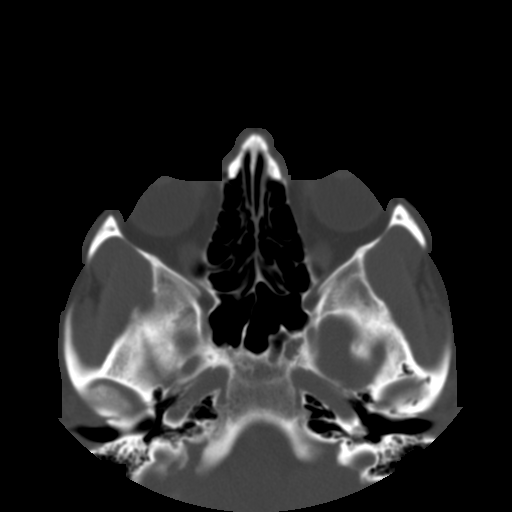
[im 32/43  brain]
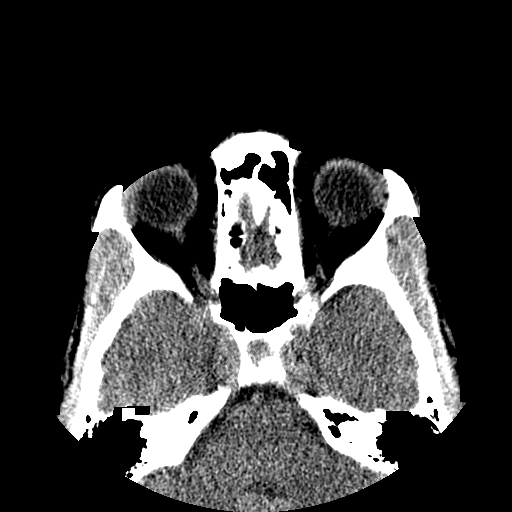
[im 37/43  brain]
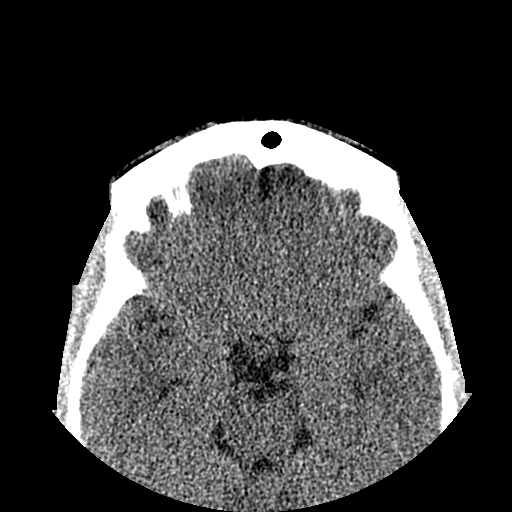

[Series 5: head w/o · axial · non-contrast · 0.49mm/px · z∈[+32,+112]mm · 4 of 28 slices shown]
[im 6/28  brain]
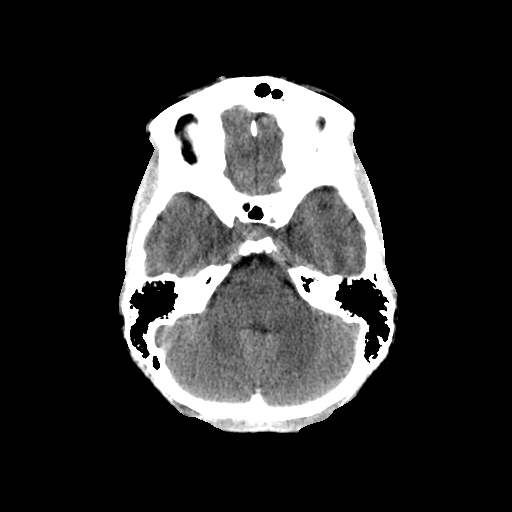
[im 11/28  brain]
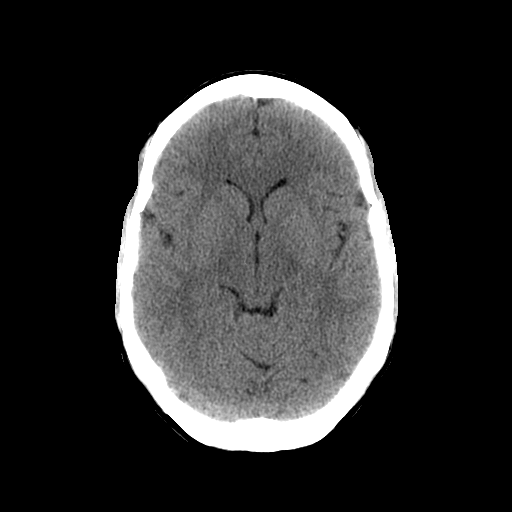
[im 17/28  brain]
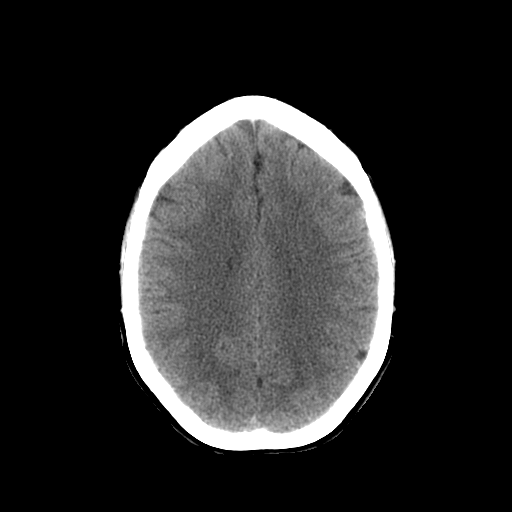
[im 22/28  brain]
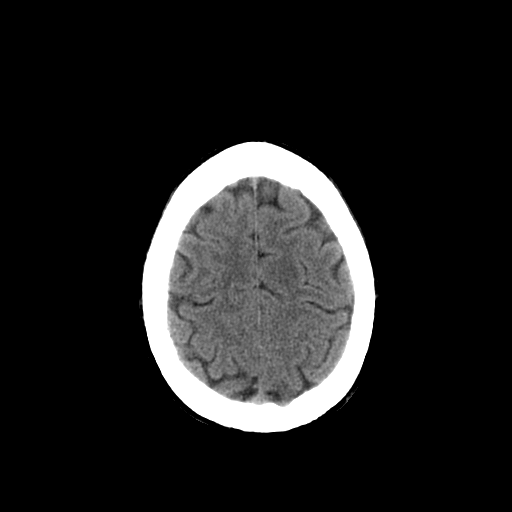

[Series 400: sagittal · sagittal · 0.33mm/px · 3 of 76 slices shown]
[im 26/76  brain]
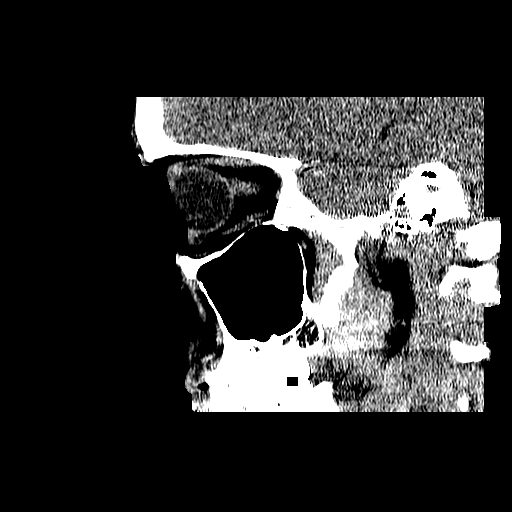
[im 38/76  brain]
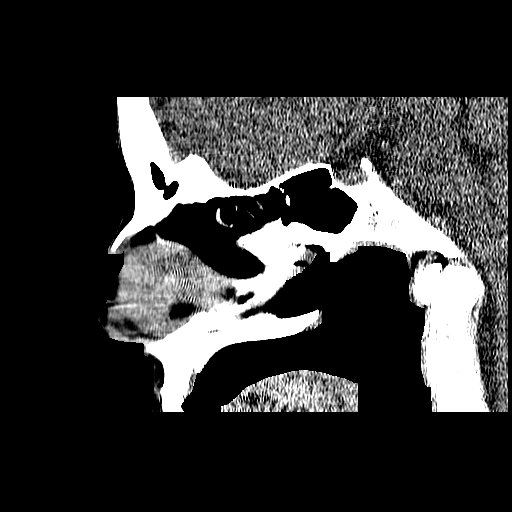
[im 51/76  brain]
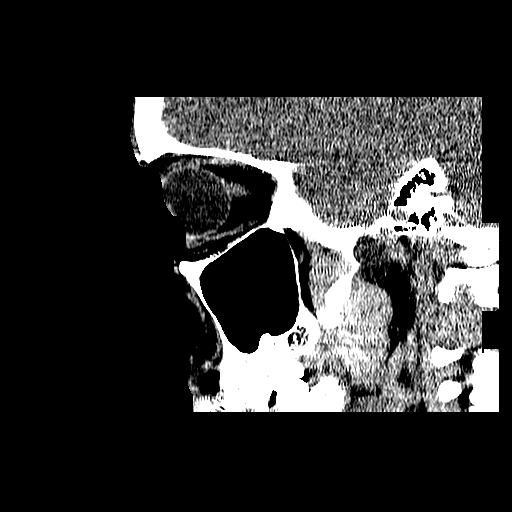

[Series 401: coronal · coronal · 0.33mm/px · 3 of 76 slices shown]
[im 26/76  brain]
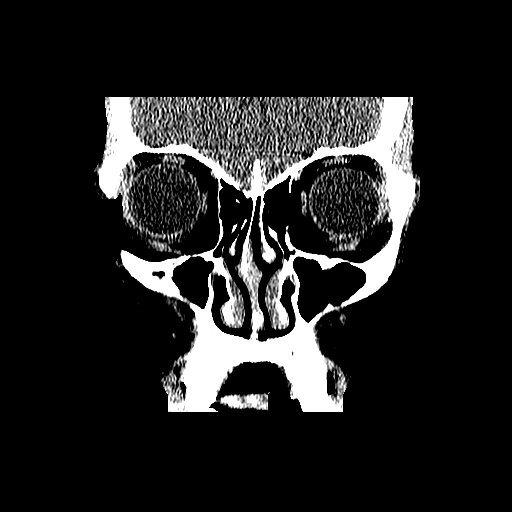
[im 34/76  brain]
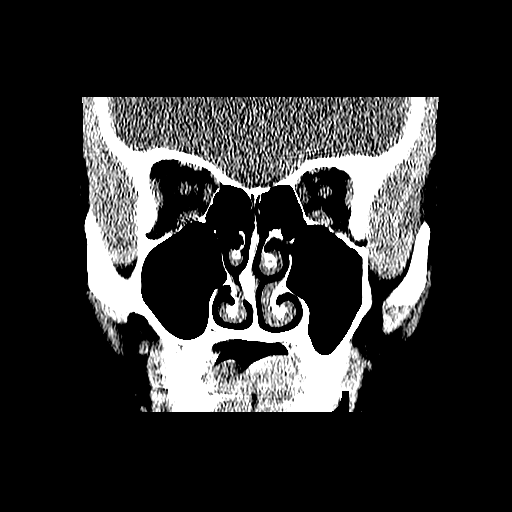
[im 42/76  brain]
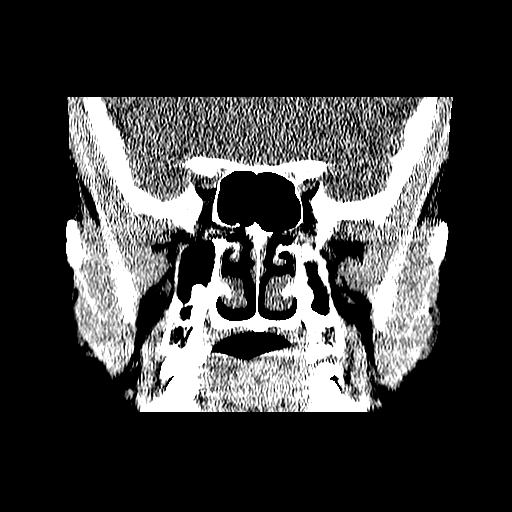

[17 of 47 positions shown; findings below may reference images not displayed]

FINDINGS: CT HEAD FINDINGS

Ventricle size is normal. Negative for intracranial mass or edema.
Negative for hemorrhage. Negative for infarct.

No skull lesion is identified.

CT MAXILLOFACIAL FINDINGS

The paranasal sinuses are clear. No significant mucosal edema.
Negative for air-fluid level. Nasal septum is deviated to the right.
No acute bony change. No mass involving the cribriform plate.
IMPRESSION: Normal CT of the head and sinuses.

## 2017-02-03 LAB — OB RESULTS CONSOLE ABO/RH: RH Type: POSITIVE

## 2017-02-03 LAB — OB RESULTS CONSOLE ANTIBODY SCREEN: Antibody Screen: NEGATIVE

## 2017-02-03 LAB — OB RESULTS CONSOLE HEPATITIS B SURFACE ANTIGEN: HEP B S AG: NEGATIVE

## 2017-02-03 LAB — OB RESULTS CONSOLE GC/CHLAMYDIA
Chlamydia: NEGATIVE
Gonorrhea: NEGATIVE

## 2017-02-03 LAB — OB RESULTS CONSOLE RPR: RPR: NONREACTIVE

## 2017-02-03 LAB — OB RESULTS CONSOLE HIV ANTIBODY (ROUTINE TESTING): HIV: NONREACTIVE

## 2017-02-03 LAB — OB RESULTS CONSOLE RUBELLA ANTIBODY, IGM: Rubella: IMMUNE

## 2017-07-28 NOTE — L&D Delivery Note (Signed)
Delivery Note At 1:21 AM, on Sep 15, 2017, a viable female "Aven Juanita CraverGrey" was delivered via Vaginal, Spontaneous (Presentation:Direct Occiput Anterior with restitution to Right OA).  Shoulders delivered easily and infant with good tone and spontaneous cry. Tactile stimulation given by provider and infant placed on mother's abdomen where nurse continued tactile stimulation. Infant APGAR: 9, 10. Cord clamped, cut, and blood collected. Placenta delivered spontaneously and noted to be intact with 3VC upon inspection.  Vaginal inspection revealed no lacerations.  Fundus firm, at the umbilicus, and bleeding small.  Mother hemodynamically stable and infant skin to skin prior to provider exit.  Mother desires pills for birth control method and opts to breastfeed.  Infant weight at one hour of life: 8lbs 2.5oz, 20.5in  Anesthesia:  Epidural Episiotomy: None Lacerations:  None Suture Repair: N/A Est. Blood Loss (mL):  150  Mom to postpartum.  Baby to Couplet care / Skin to Skin.  Cherre RobinsJessica L Malayjah Otoole MSN, CNM 09/15/2017, 1:35 AM

## 2017-08-14 LAB — OB RESULTS CONSOLE GBS: GBS: POSITIVE

## 2017-09-14 ENCOUNTER — Inpatient Hospital Stay (HOSPITAL_COMMUNITY): Payer: BLUE CROSS/BLUE SHIELD | Admitting: Anesthesiology

## 2017-09-14 ENCOUNTER — Inpatient Hospital Stay (HOSPITAL_COMMUNITY)
Admission: AD | Admit: 2017-09-14 | Discharge: 2017-09-16 | DRG: 806 | Disposition: A | Discharge status: Home / Self Care | Payer: BLUE CROSS/BLUE SHIELD | Source: Ambulatory Visit | Attending: Obstetrics and Gynecology | Admitting: Obstetrics and Gynecology

## 2017-09-14 ENCOUNTER — Other Ambulatory Visit: Payer: Self-pay

## 2017-09-14 ENCOUNTER — Encounter (HOSPITAL_COMMUNITY): Payer: Self-pay | Admitting: *Deleted

## 2017-09-14 DIAGNOSIS — O99820 Streptococcus B carrier state complicating pregnancy: Secondary | ICD-10-CM

## 2017-09-14 DIAGNOSIS — O4292 Full-term premature rupture of membranes, unspecified as to length of time between rupture and onset of labor: Secondary | ICD-10-CM | POA: Diagnosis present

## 2017-09-14 DIAGNOSIS — O9912 Other diseases of the blood and blood-forming organs and certain disorders involving the immune mechanism complicating childbirth: Secondary | ICD-10-CM | POA: Diagnosis present

## 2017-09-14 DIAGNOSIS — D696 Thrombocytopenia, unspecified: Secondary | ICD-10-CM | POA: Diagnosis present

## 2017-09-14 DIAGNOSIS — R010 Benign and innocent cardiac murmurs: Secondary | ICD-10-CM | POA: Diagnosis present

## 2017-09-14 DIAGNOSIS — Z3A40 40 weeks gestation of pregnancy: Secondary | ICD-10-CM | POA: Diagnosis not present

## 2017-09-14 DIAGNOSIS — O99824 Streptococcus B carrier state complicating childbirth: Secondary | ICD-10-CM | POA: Diagnosis present

## 2017-09-14 DIAGNOSIS — Z8659 Personal history of other mental and behavioral disorders: Secondary | ICD-10-CM

## 2017-09-14 DIAGNOSIS — O99119 Other diseases of the blood and blood-forming organs and certain disorders involving the immune mechanism complicating pregnancy, unspecified trimester: Secondary | ICD-10-CM

## 2017-09-14 HISTORY — DX: Thrombocytopenia, unspecified: D69.6

## 2017-09-14 LAB — CBC
HEMATOCRIT: 36.2 % (ref 36.0–46.0)
HEMOGLOBIN: 12.9 g/dL (ref 12.0–15.0)
MCH: 33.6 pg (ref 26.0–34.0)
MCHC: 35.6 g/dL (ref 30.0–36.0)
MCV: 94.3 fL (ref 78.0–100.0)
Platelets: 120 10*3/uL — ABNORMAL LOW (ref 150–400)
RBC: 3.84 MIL/uL — AB (ref 3.87–5.11)
RDW: 12.8 % (ref 11.5–15.5)
WBC: 8.9 10*3/uL (ref 4.0–10.5)

## 2017-09-14 LAB — TYPE AND SCREEN
ABO/RH(D): A POS
ANTIBODY SCREEN: NEGATIVE

## 2017-09-14 LAB — POCT FERN TEST: POCT FERN TEST: POSITIVE

## 2017-09-14 MED ORDER — PHENYLEPHRINE 40 MCG/ML (10ML) SYRINGE FOR IV PUSH (FOR BLOOD PRESSURE SUPPORT)
PREFILLED_SYRINGE | INTRAVENOUS | Status: AC
  Filled 2017-09-14: qty 10

## 2017-09-14 MED ORDER — DIPHENHYDRAMINE HCL 50 MG/ML IJ SOLN: 12.5000 mg | INTRAMUSCULAR | Status: DC | PRN

## 2017-09-14 MED ORDER — PHENYLEPHRINE 40 MCG/ML (10ML) SYRINGE FOR IV PUSH (FOR BLOOD PRESSURE SUPPORT)
80.0000 ug | PREFILLED_SYRINGE | INTRAVENOUS | Status: DC | PRN
  Filled 2017-09-14: qty 5

## 2017-09-14 MED ORDER — EPHEDRINE 5 MG/ML INJ
10.0000 mg | INTRAVENOUS | Status: DC | PRN
  Filled 2017-09-14: qty 2

## 2017-09-14 MED ORDER — FENTANYL 2.5 MCG/ML BUPIVACAINE 1/10 % EPIDURAL INFUSION (WH - ANES)
INTRAMUSCULAR | Status: AC
  Filled 2017-09-14: qty 100

## 2017-09-14 MED ORDER — LACTATED RINGERS IV SOLN
INTRAVENOUS | Status: DC
  Administered 2017-09-14 (×2): via INTRAVENOUS

## 2017-09-14 MED ORDER — OXYTOCIN BOLUS FROM INFUSION
500.0000 mL | Freq: Once | INTRAVENOUS | Status: AC
  Administered 2017-09-15: 500 mL via INTRAVENOUS

## 2017-09-14 MED ORDER — ACETAMINOPHEN 325 MG PO TABS: 650.0000 mg | ORAL_TABLET | ORAL | Status: DC | PRN

## 2017-09-14 MED ORDER — LIDOCAINE HCL (PF) 1 % IJ SOLN
30.0000 mL | INTRAMUSCULAR | Status: DC | PRN
  Filled 2017-09-14: qty 30

## 2017-09-14 MED ORDER — ONDANSETRON HCL 4 MG/2ML IJ SOLN: 4.0000 mg | Freq: Four times a day (QID) | INTRAMUSCULAR | Status: DC | PRN

## 2017-09-14 MED ORDER — SODIUM CHLORIDE 0.9 % IV SOLN
5.0000 10*6.[IU] | Freq: Once | INTRAVENOUS | Status: AC
  Administered 2017-09-14: 5 10*6.[IU] via INTRAVENOUS
  Filled 2017-09-14: qty 5

## 2017-09-14 MED ORDER — OXYTOCIN 40 UNITS IN LACTATED RINGERS INFUSION - SIMPLE MED
2.5000 [IU]/h | INTRAVENOUS | Status: DC
  Administered 2017-09-15: 2.5 [IU]/h via INTRAVENOUS
  Filled 2017-09-14: qty 1000

## 2017-09-14 MED ORDER — LACTATED RINGERS IV SOLN
500.0000 mL | Freq: Once | INTRAVENOUS | Status: AC
  Administered 2017-09-14: 500 mL via INTRAVENOUS

## 2017-09-14 MED ORDER — SOD CITRATE-CITRIC ACID 500-334 MG/5ML PO SOLN: 30.0000 mL | ORAL | Status: DC | PRN

## 2017-09-14 MED ORDER — LACTATED RINGERS IV SOLN: 500.0000 mL | INTRAVENOUS | Status: DC | PRN

## 2017-09-14 MED ORDER — FENTANYL 2.5 MCG/ML BUPIVACAINE 1/10 % EPIDURAL INFUSION (WH - ANES)
14.0000 mL/h | INTRAMUSCULAR | Status: DC | PRN
  Administered 2017-09-14: 14 mL/h via EPIDURAL

## 2017-09-14 MED ORDER — PENICILLIN G POT IN DEXTROSE 60000 UNIT/ML IV SOLN
3.0000 10*6.[IU] | INTRAVENOUS | Status: DC
  Filled 2017-09-14 (×4): qty 50

## 2017-09-14 MED ORDER — FENTANYL CITRATE (PF) 100 MCG/2ML IJ SOLN: 50.0000 ug | INTRAMUSCULAR | Status: DC | PRN

## 2017-09-14 MED ORDER — LIDOCAINE HCL (PF) 1 % IJ SOLN
INTRAMUSCULAR | Status: DC | PRN
  Administered 2017-09-14: 3 mL
  Administered 2017-09-14: 7 mL via EPIDURAL

## 2017-09-14 NOTE — Anesthesia Pain Management Evaluation Note (Signed)
  CR[facility] Pain Management Visit Note  Patient: Margaretmary LombardLauren B Matsuura, 32 y.o., female  "Hello I am a member of the anesthesia team at [facility]. We have an anesthesia team available at all times to provide care throughout the hospital, including epidural management and anesthesia for C-section. I don't know your plan for the delivery whether it a natural birth, water birth, IV sedation, nitrous supplementation, doula or epidural, but we want to meet your pain goals."   1.Was your pain managed to your expectations on prior hospitalizations?   Yes   2.What is your expectation for pain management during this hospitalization?     Epidural  3.How can we help you reach that goal? Epidural placement in progress.  Record the patient's initial score and the patient's pain goal.   Pain: 10  Pain Goal: 2 [facility] wants you to be able to say your pain was always managed very well.  Daysha Ashmore 09/14/2017

## 2017-09-14 NOTE — MAU Note (Signed)
Couple trickles around 1000 this morning, then some bloody show.  Has continued with trickles and small gushes of clear fluid all day, contractions have now started 5-6 min apart.

## 2017-09-14 NOTE — H&P (Signed)
Margaretmary LombardLauren B Kressin is a 32 y.o. female presenting for SROM. Patient reports leakage of fluid that started around 10am and has been ongoing.  Reports fluid is clear with some bloody show noted.  Patient reports contractions started upon arrival to MAU.  Patient is under the care of [facility] and pregnancy history significant for depression as a teen, heart murmur, thrombocytopenia, and positive GBS.  Patient does desire an epidural for pain management and is expecting a female child.   OB History    Gravida Para Term Preterm AB Living   4 2 2   1 2    SAB TAB Ectopic Multiple Live Births   1     0 2     Past Medical History:  Diagnosis Date  . Anemia 2010   NO MEDS  . Depression 2005   NO MEDS  . Headache(784.0)    OCC  . Heart murmur    HAD LONG TIME; WAS ADVISED PROPHYLAXSIS FOR DENTIST  . Hx: UTI (urinary tract infection) 2012  . PCOS (polycystic ovarian syndrome) 2012   Past Surgical History:  Procedure Laterality Date  . WISDOM TOOTH EXTRACTION     Family History: family history includes Anemia in her paternal grandfather; Cancer in her maternal grandfather and paternal grandfather; Depression in her mother; Down syndrome in her paternal uncle; Early death (age of onset: 5346) in her maternal grandmother; Heart disease in her maternal grandfather, mother, and paternal grandfather; Hypertension in her paternal grandmother; Migraines in her mother; Mitral valve prolapse in her mother; Other in her mother; Stroke in her maternal grandfather and paternal uncle. Social History:  reports that  has never smoked. she has never used smokeless tobacco. She reports that she drinks about 2.0 - 2.5 oz of alcohol per week. She reports that she does not use drugs.     Maternal Diabetes: No Genetic Screening: Normal Maternal Ultrasounds/Referrals: Normal Fetal Ultrasounds or other Referrals:  None Maternal Substance Abuse:  No Significant Maternal Medications:  None Significant Maternal Lab Results:   Lab values include: Group B Strep positive Other Comments:  None  Review of Systems  All other systems reviewed and are negative.  Maternal Medical History:  Reason for admission: Rupture of membranes.   Contractions: Onset was less than 1 hour ago.   Frequency: regular.   Perceived severity is moderate.    Fetal activity: Perceived fetal activity is normal.   Last perceived fetal movement was within the past hour.    Prenatal complications: no prenatal complications Prenatal Complications - Diabetes: none.    Dilation: 4.5 Effacement (%): 70 Station: -2, -3 Exam by:: GrenadaBrittany Bowen, RN  Blood pressure 121/77, pulse 81, temperature 98.1 F (36.7 C), temperature source Oral, resp. rate 16, weight 80.2 kg (176 lb 12 oz), SpO2 100 %, unknown if currently breastfeeding. Maternal Exam:  Uterine Assessment: Contraction strength is mild.  Contraction frequency is irregular.   Abdomen: Patient reports no abdominal tenderness. Fundal height is AGA.   Estimated fetal weight is Not Assessed.   Fetal presentation: vertex  Introitus: Ferning test: positive.   Cervix: Cervix evaluated by digital exam.     Fetal Exam Fetal Monitor Review: Baseline rate: 125.  Variability: moderate (6-25 bpm).   Pattern: accelerations present.    Fetal State Assessment: Category I - tracings are normal.     Physical Exam  Constitutional: She is oriented to person, place, and time. She appears well-developed and well-nourished. No distress.  HENT:  Head: Normocephalic and atraumatic.  Eyes: Conjunctivae are normal.  Neck: Normal range of motion.  Cardiovascular: Normal rate and regular rhythm.  Respiratory: Effort normal and breath sounds normal.  GI: Soft. Bowel sounds are normal.  Gravid--fundal height appears AGA, Soft, NT   Musculoskeletal: Normal range of motion. She exhibits no edema.  Neurological: She is alert and oriented to person, place, and time.  Skin: Skin is warm and dry.     Prenatal labs: ABO, Rh: A/Positive/-- (07/10 0000) Antibody: Negative (07/10 0000) Rubella: Immune (07/10 0000) RPR: Nonreactive (07/10 0000)  HBsAg: Negative (07/10 0000)  HIV: Non-reactive (07/10 0000)  GBS: Positive (01/18 0000)   Assessment/Plan: IUP at 40.3wks Cat I FT SROM Thrombocytopenia D/O H/O Depression GBS Positive  Admit to [facility]thing Suites per consult with Dr. Kathie Rhodes. Riv[place] Routine Labor and Delivery Orders per [facility] Protocol In room to complete assessment and discuss POC: PCN for GBS prophylaxis Okay for epidural when desired Expectant mgmt at current Dr.T. Cole to be updated as appropriate    Cherre Robins MSN, CNM 09/14/2017, 7:12 PM

## 2017-09-14 NOTE — Anesthesia Procedure Notes (Signed)
Epidural Patient location during procedure: OB Start time: 09/14/2017 8:22 PM End time: 09/14/2017 8:26 PM  Staffing Anesthesiologist: Leilani AbleHatchett, Ryna Beckstrom, MD Performed: anesthesiologist   Preanesthetic Checklist Completed: patient identified, site marked, surgical consent, pre-op evaluation, timeout performed, IV checked, risks and benefits discussed and monitors and equipment checked  Epidural Patient position: sitting Prep: site prepped and draped and DuraPrep Patient monitoring: continuous pulse ox and blood pressure Approach: midline Location: L3-L4 Injection technique: LOR air  Needle:  Needle type: Tuohy  Needle gauge: 17 G Needle length: 9 cm and 9 Needle insertion depth: 5 cm cm Catheter type: closed end flexible Catheter size: 19 Gauge Catheter at skin depth: 10 cm Test dose: negative and Other  Assessment Sensory level: T10 Events: blood not aspirated, injection not painful, no injection resistance, negative IV test and no paresthesia  Additional Notes Reason for block:procedure for pain

## 2017-09-14 NOTE — Anesthesia Preprocedure Evaluation (Addendum)
Anesthesia Evaluation  Patient identified by MRN, date of birth, ID band Patient awake    Reviewed: Allergy & Precautions, H&P , NPO status , Patient's Chart, lab work & pertinent test results  Airway Mallampati: I  TM Distance: >3 FB Neck ROM: full    Dental no notable dental hx. (+) Teeth Intact   Pulmonary neg pulmonary ROS,    Pulmonary exam normal breath sounds clear to auscultation       Cardiovascular negative cardio ROS Normal cardiovascular exam Rhythm:regular Rate:Normal     Neuro/Psych negative neurological ROS  negative psych ROS   GI/Hepatic negative GI ROS, Neg liver ROS,   Endo/Other  negative endocrine ROS  Renal/GU negative Renal ROS  negative genitourinary   Musculoskeletal   Abdominal Normal abdominal exam  (+)   Peds  Hematology negative hematology ROS (+)   Anesthesia Other Findings   Reproductive/Obstetrics (+) Pregnancy                             Anesthesia Physical Anesthesia Plan  ASA: II  Anesthesia Plan: Epidural   Post-op Pain Management:    Induction:   PONV Risk Score and Plan:   Airway Management Planned: Natural Airway  Additional Equipment:   Intra-op Plan:   Post-operative Plan:   Informed Consent: I have reviewed the patients History and Physical, chart, labs and discussed the procedure including the risks, benefits and alternatives for the proposed anesthesia with the patient or authorized representative who has indicated his/her understanding and acceptance.     Plan Discussed with: Anesthesiologist  Anesthesia Plan Comments:       Anesthesia Quick Evaluation

## 2017-09-15 ENCOUNTER — Other Ambulatory Visit: Payer: Self-pay

## 2017-09-15 ENCOUNTER — Encounter (HOSPITAL_COMMUNITY): Payer: Self-pay

## 2017-09-15 LAB — RPR: RPR Ser Ql: NONREACTIVE

## 2017-09-15 LAB — CBC
HCT: 35 % — ABNORMAL LOW (ref 36.0–46.0)
HEMOGLOBIN: 12.5 g/dL (ref 12.0–15.0)
MCH: 33.6 pg (ref 26.0–34.0)
MCHC: 35.7 g/dL (ref 30.0–36.0)
MCV: 94.1 fL (ref 78.0–100.0)
PLATELETS: 113 10*3/uL — AB (ref 150–400)
RBC: 3.72 MIL/uL — AB (ref 3.87–5.11)
RDW: 12.9 % (ref 11.5–15.5)
WBC: 12.7 10*3/uL — AB (ref 4.0–10.5)

## 2017-09-15 MED ORDER — WITCH HAZEL-GLYCERIN EX PADS
1.0000 "application " | MEDICATED_PAD | CUTANEOUS | Status: DC | PRN
  Administered 2017-09-15: 1 via TOPICAL

## 2017-09-15 MED ORDER — SENNOSIDES-DOCUSATE SODIUM 8.6-50 MG PO TABS
2.0000 | ORAL_TABLET | ORAL | Status: DC
  Administered 2017-09-16: 2 via ORAL
  Filled 2017-09-15: qty 2

## 2017-09-15 MED ORDER — IBUPROFEN 600 MG PO TABS
600.0000 mg | ORAL_TABLET | Freq: Four times a day (QID) | ORAL | Status: DC
  Administered 2017-09-15 – 2017-09-16 (×5): 600 mg via ORAL
  Filled 2017-09-15 (×6): qty 1

## 2017-09-15 MED ORDER — ONDANSETRON HCL 4 MG PO TABS: 4.0000 mg | ORAL_TABLET | ORAL | Status: DC | PRN

## 2017-09-15 MED ORDER — COCONUT OIL OIL: 1.0000 "application " | TOPICAL_OIL | Status: DC | PRN

## 2017-09-15 MED ORDER — PRENATAL MULTIVITAMIN CH
1.0000 | ORAL_TABLET | Freq: Every day | ORAL | Status: DC
  Administered 2017-09-15 – 2017-09-16 (×2): 1 via ORAL
  Filled 2017-09-15 (×2): qty 1

## 2017-09-15 MED ORDER — DIPHENHYDRAMINE HCL 25 MG PO CAPS: 25.0000 mg | ORAL_CAPSULE | Freq: Four times a day (QID) | ORAL | Status: DC | PRN

## 2017-09-15 MED ORDER — TETANUS-DIPHTH-ACELL PERTUSSIS 5-2.5-18.5 LF-MCG/0.5 IM SUSP: 0.5000 mL | Freq: Once | INTRAMUSCULAR | Status: DC

## 2017-09-15 MED ORDER — DIBUCAINE 1 % RE OINT
1.0000 "application " | TOPICAL_OINTMENT | RECTAL | Status: DC | PRN
  Administered 2017-09-15: 1 via RECTAL
  Filled 2017-09-15: qty 28

## 2017-09-15 MED ORDER — ONDANSETRON HCL 4 MG/2ML IJ SOLN: 4.0000 mg | INTRAMUSCULAR | Status: DC | PRN

## 2017-09-15 MED ORDER — ZOLPIDEM TARTRATE 5 MG PO TABS: 5.0000 mg | ORAL_TABLET | Freq: Every evening | ORAL | Status: DC | PRN

## 2017-09-15 MED ORDER — BENZOCAINE-MENTHOL 20-0.5 % EX AERO
1.0000 "application " | INHALATION_SPRAY | CUTANEOUS | Status: DC | PRN
  Administered 2017-09-15: 1 via TOPICAL
  Filled 2017-09-15: qty 56

## 2017-09-15 MED ORDER — SIMETHICONE 80 MG PO CHEW: 80.0000 mg | CHEWABLE_TABLET | ORAL | Status: DC | PRN

## 2017-09-15 MED ORDER — ACETAMINOPHEN 325 MG PO TABS
650.0000 mg | ORAL_TABLET | ORAL | Status: DC | PRN
  Administered 2017-09-15: 650 mg via ORAL
  Filled 2017-09-15 (×2): qty 2

## 2017-09-15 NOTE — Anesthesia Postprocedure Evaluation (Signed)
Anesthesia Post Note  Patient: Maria Douglas  Procedure(s) Performed: AN AD HOC LABOR EPIDURAL     Patient location during evaluation: Mother Baby Anesthesia Type: Epidural Level of consciousness: awake and alert Pain management: pain level controlled Vital Signs Assessment: post-procedure vital signs reviewed and stable Respiratory status: spontaneous breathing, nonlabored ventilation and respiratory function stable Cardiovascular status: stable Postop Assessment: no headache, no backache, epidural receding, no apparent nausea or vomiting, patient able to bend at knees and adequate PO intake Anesthetic complications: no    Last Vitals:  Vitals:   09/15/17 0344 09/15/17 0500  BP: (!) 101/59 (!) 96/56  Pulse: 66 65  Resp: 18 18  Temp: 36.8 C 37 C  SpO2:      Last Pain:  Vitals:   09/15/17 0300  TempSrc:   PainSc: 0-No pain   Pain Goal: Patients Stated Pain Goal: 1 (09/14/17 2205)               Laban EmperorMalinova,[name]

## 2017-09-15 NOTE — Lactation Note (Signed)
This note was copied from a baby's chart. Lactation Consultation Note  Patient Name: Maria Douglas Today's Date: 09/15/2017 Reason for consult: Initial assessment   Initial consult with Exp BF mom of 12 hour old infant. Infant with 4 BF for 10-15 minutes, 1 BF attempt, 2 voids and 1 stool since birth. LATCH scores 7-8. Mom was holding infant and reports infant just finished feeding. Infant was gaggy and mom reports just spit up a little. Enc mo to try to burp infant.   Mom reports she BF her 383 and 31 year old sons for 18 months each. She reports she had no difficulties with BF. Mom has Medela pump at home for use.   BF basics, pillow and head support, hand expression,  Cluster feeding, colostrum and milk coming to volume reviewed. Mom reports some tenderness with initial latch that improves with feeding, enc hand expression and EBM to nipples post BF.   BF Resources handout and LC Brochure given, mom informed of IP/OP Services, BF Support Groups and LC phone # given. Enc mom to call out for feeding assistance or questions as needed.   Mom reports she does not have any questions/concerns at this time. Mom denied need for [facility] assistance at this time.    Maternal Data Formula Feeding for Exclusion: No Has patient been taught Hand Expression?: Yes Does the patient have breastfeeding experience prior to this delivery?: Yes  Feeding Feeding Type: Breast Fed  LATCH Score Latch: Repeated attempts needed to sustain latch, nipple held in mouth throughout feeding, stimulation needed to elicit sucking reflex.  Audible Swallowing: A few with stimulation  Type of Nipple: Everted at rest and after stimulation  Comfort (Breast/Nipple): Soft / non-tender  Hold (Positioning): No assistance needed to correctly position infant at breast.  LATCH Score: 8  Interventions Interventions: Breast feeding basics reviewed;Skin to skin;Breast massage;Breast compression;Hand express;Support  pillows  Lactation Tools Discussed/Used WIC Program: No   Consult Status Consult Status: Follow-up Date: 09/16/17 Follow-up type: In-patient    Maria Douglas 09/15/2017, 1:37 PM

## 2017-09-15 NOTE — Progress Notes (Signed)
Maria LombardLauren B Douglas MRN: [id number]  Subjective: -Strip and chart reviewed.  Objective: BP 106/64   Pulse 73   Temp 97.8 F (36.6 C) (Oral)   Resp 18   Ht 5\' 5"  (1.651 m)   Wt 80.2 kg (176 lb 12 oz)   SpO2 100%   BMI 29.41 kg/m  No intake/output data recorded. No intake/output data recorded.  Fetal Monitoring: FHT: 125 bpm, Mod Var, -Decels, +Accels UC: Q1-73min    Vaginal Exam: SVE:   Dilation: 5 Effacement (%): 80 Station: -2, -1 Exam by:: e. poore, rnc Membranes:AROM x 13 hrs Internal Monitors: None  Augmentation/Induction: Pitocin:None Cytotec: None  Assessment:  IUP at 40.4wks Cat I FT  Active Labor  Plan: -Continue expectant mgmt -Continue other mgmt as ordered  Joyce CopaJessica L Milda Lindvall,MSN, CNM 09/15/2017, 12:00 AM

## 2017-09-16 LAB — BIRTH TISSUE RECOVERY COLLECTION (PLACENTA DONATION)

## 2017-09-16 LAB — CBC
HEMATOCRIT: 33.4 % — AB (ref 36.0–46.0)
HEMOGLOBIN: 11.6 g/dL — AB (ref 12.0–15.0)
MCH: 33 pg (ref 26.0–34.0)
MCHC: 34.7 g/dL (ref 30.0–36.0)
MCV: 95.2 fL (ref 78.0–100.0)
Platelets: 114 10*3/uL — ABNORMAL LOW (ref 150–400)
RBC: 3.51 MIL/uL — ABNORMAL LOW (ref 3.87–5.11)
RDW: 13.1 % (ref 11.5–15.5)
WBC: 7.8 10*3/uL (ref 4.0–10.5)

## 2017-09-16 NOTE — Progress Notes (Addendum)
MOB was referred for history of depression/anxiety. * Referral screened out by Clinical Social Worker because none of the following criteria appear to apply: ~ History of anxiety/depression during this pregnancy, or of post-partum depression. ~ Diagnosis of anxiety and/or depression within last 3 years OR * MOB's symptoms currently being treated with medication and/or therapy. Please contact the Clinical Social Worker if needs arise or by MOB request.  CSW notes that MOB scored a 6 on the Edinburgh Postpartum Depression Screen during this hospitalization.  Chart notes depression in 2005 at age 18.  MOB is now 31 years old. 

## 2017-09-16 NOTE — Lactation Note (Signed)
This note was copied from a baby's chart. Lactation Consultation Note  Patient Name: Maria Douglas Today's Date: 09/16/2017  Mom has no questions or concerns about breastfeeding.  Maria Douglas, [facility] Douglas [facility] 09/16/2017, 10:21 AM

## 2017-09-16 NOTE — Discharge Summary (Signed)
OB Discharge Summary     Patient Name: Maria Douglas DOB: Dec 14, 1985 MRN: [id number]  Date of admission: 09/14/2017 Delivering MD: Maria Douglas, Maria Douglas   Date of discharge: 09/16/2017  Admitting diagnosis: 40.3WKS LEAKING FLUID Intrauterine pregnancy: 4325w4d     Secondary diagnosis:  Principal Problem:   SVD (spontaneous vaginal delivery) Active Problems:   Benign heart murmur   GBS (group B Streptococcus carrier), +RV culture, currently pregnant   History of depression - age 32, no meds   Gestational thrombocytopenia (HCC)   Indication for care or intervention in labor or delivery  Additional problems: None     Discharge diagnosis: Term Pregnancy Delivered                                                                                                Post partum procedures:None  Augmentation: AROM  Complications: None  Hospital course:  Onset of Labor With Vaginal Delivery     32 y.o. yo [id number] at 8525w4d was admitted in Active Labor on 09/14/2017. Patient had an uncomplicated labor course as follows:  Membrane Rupture Time/Date: 10:00 AM ,09/14/2017   Intrapartum Procedures: Episiotomy: None [1]                                         Lacerations:  None [1]  Patient had a delivery of a Viable infant. 09/15/2017  Information for the patient's newborn:  Maria Douglas, Girl Maria Douglas [[id number]][[id number]]       Pateint had an uncomplicated postpartum course.  She is ambulating, tolerating a regular diet, passing flatus, and urinating well. Patient is discharged home in stable condition on 09/16/17.   Physical exam  Vitals:   09/15/17 0942 09/15/17 1716 09/15/17 2003 09/16/17 0500  BP: 110/63 114/70 117/89 112/74  Pulse: 60 63 71 63  Resp: 20 20 18 18   Temp: 98 F (36.7 C) 98.3 F (36.8 C) (!) 97.5 F (36.4 C) 98 F (36.7 C)  TempSrc: Axillary Axillary  Oral  SpO2:  100%    Weight:      Height:       General: alert, cooperative and no distress Lochia: appropriate Uterine Fundus:  firm Incision: N/A DVT Evaluation: No evidence of DVT seen on physical exam. Negative Homan's sign. Labs: Lab Results  Component Value Date   WBC 7.8 09/16/2017   HGB 11.6 (L) 09/16/2017   HCT 33.4 (L) 09/16/2017   MCV 95.2 09/16/2017   PLT 114 (L) 09/16/2017   No flowsheet data found.  Discharge instruction: per After Visit Summary and "Baby and Me Booklet".  After visit meds:    Diet: routine diet  Activity: Advance as tolerated. Pelvic rest for 6 weeks.   Outpatient follow up:6 weeks Follow up Appt:No future appointments. Follow up Visit:No Follow-up on file.  Postpartum contraception: Undecided  Newborn Data: Live born female  Birth Weight: 8 lb 2.5 oz (3700 g) APGAR: 9, 10  Newborn Delivery   Birth date/time:  09/15/2017 01:21:00 Delivery type:  Vaginal, Spontaneous  Baby Feeding: Bottle Disposition:home with mother   09/16/2017 Maria Douglas, CNM

## 2017-09-16 NOTE — Discharge Instructions (Signed)
Vaginal Delivery, Care After °Refer to this sheet in the next few weeks. These instructions provide you with information about caring for yourself after vaginal delivery. Your health care provider may also give you more specific instructions. Your treatment has been planned according to current medical practices, but problems sometimes occur. Call your health care provider if you have any problems or questions. °What can I expect after the procedure? °After vaginal delivery, it is common to have: °· Some bleeding from your vagina. °· Soreness in your abdomen, your vagina, and the area of skin between your vaginal opening and your anus (perineum). °· Pelvic cramps. °· Fatigue. ° °Follow these instructions at home: °Medicines °· Take over-the-counter and prescription medicines only as told by your health care provider. °· If you were prescribed an antibiotic medicine, take it as told by your health care provider. Do not stop taking the antibiotic until it is finished. °Driving ° °· Do not drive or operate heavy machinery while taking prescription pain medicine. °· Do not drive for 24 hours if you received a sedative. °Lifestyle °· Do not drink alcohol. This is especially important if you are breastfeeding or taking medicine to relieve pain. °· Do not use tobacco products, including cigarettes, chewing tobacco, or e-cigarettes. If you need help quitting, ask your health care provider. °Eating and drinking °· Drink at least 8 eight-ounce glasses of water every day unless you are told not to by your health care provider. If you choose to breastfeed your baby, you may need to drink more water than this. °· Eat high-fiber foods every day. These foods may help prevent or relieve constipation. High-fiber foods include: °? Whole grain cereals and breads. °? Brown rice. °? Beans. °? Fresh fruits and vegetables. °Activity °· Return to your normal activities as told by your health care provider. Ask your health care provider  what activities are safe for you. °· Rest as much as possible. Try to rest or take a nap when your baby is sleeping. °· Do not lift anything that is heavier than your baby or 10 lb (4.5 kg) until your health care provider says that it is safe. °· Talk with your health care provider about when you can engage in sexual activity. This may depend on your: °? Risk of infection. °? Rate of healing. °? Comfort and desire to engage in sexual activity. °Vaginal Care °· If you have an episiotomy or a vaginal tear, check the area every day for signs of infection. Check for: °? More redness, swelling, or pain. °? More fluid or blood. °? Warmth. °? Pus or a bad smell. °· Do not use tampons or douches until your health care provider says this is safe. °· Watch for any blood clots that may pass from your vagina. These may look like clumps of dark red, brown, or black discharge. °General instructions °· Keep your perineum clean and dry as told by your health care provider. °· Wear loose, comfortable clothing. °· Wipe from front to back when you use the toilet. °· Ask your health care provider if you can shower or take a bath. If you had an episiotomy or a perineal tear during labor and delivery, your health care provider may tell you not to take baths for a certain length of time. °· Wear a bra that supports your breasts and fits you well. °· If possible, have someone help you with household activities and help care for your baby for at least a few days after   you leave the hospital. °· Keep all follow-up visits for you and your baby as told by your health care provider. This is important. °Contact a health care provider if: °· You have: °? Vaginal discharge that has a bad smell. °? Difficulty urinating. °? Pain when urinating. °? A sudden increase or decrease in the frequency of your bowel movements. °? More redness, swelling, or pain around your episiotomy or vaginal tear. °? More fluid or blood coming from your episiotomy or  vaginal tear. °? Pus or a bad smell coming from your episiotomy or vaginal tear. °? A fever. °? A rash. °? Little or no interest in activities you used to enjoy. °? Questions about caring for yourself or your baby. °· Your episiotomy or vaginal tear feels warm to the touch. °· Your episiotomy or vaginal tear is separating or does not appear to be healing. °· Your breasts are painful, hard, or turn red. °· You feel unusually sad or worried. °· You feel nauseous or you vomit. °· You pass large blood clots from your vagina. If you pass a blood clot from your vagina, save it to show to your health care provider. Do not flush blood clots down the toilet without having your health care provider look at them. °· You urinate more than usual. °· You are dizzy or light-headed. °· You have not breastfed at all and you have not had a menstrual period for 12 weeks after delivery. °· You have stopped breastfeeding and you have not had a menstrual period for 12 weeks after you stopped breastfeeding. °Get help right away if: °· You have: °? Pain that does not go away or does not get better with medicine. °? Chest pain. °? Difficulty breathing. °? Blurred vision or spots in your vision. °? Thoughts about hurting yourself or your baby. °· You develop pain in your abdomen or in one of your legs. °· You develop a severe headache. °· You faint. °· You bleed from your vagina so much that you fill two sanitary pads in one hour. °This information is not intended to replace advice given to you by your health care provider. Make sure you discuss any questions you have with your health care provider. °Document Released: 07/11/2000 Document Revised: 12/26/2015 Document Reviewed: 07/29/2015 °Elsevier Interactive Patient Education © 2018 [employer] ° °

## 2022-01-30 ENCOUNTER — Other Ambulatory Visit: Payer: Self-pay | Admitting: Obstetrics and Gynecology

## 2022-01-30 DIAGNOSIS — Z3689 Encounter for other specified antenatal screening: Secondary | ICD-10-CM

## 2022-01-31 ENCOUNTER — Ambulatory Visit: Payer: BC Managed Care – PPO | Admitting: *Deleted

## 2022-01-31 ENCOUNTER — Ambulatory Visit: Payer: BC Managed Care – PPO

## 2022-01-31 ENCOUNTER — Ambulatory Visit: Payer: BC Managed Care – PPO | Attending: Obstetrics and Gynecology

## 2022-01-31 ENCOUNTER — Other Ambulatory Visit: Payer: Self-pay | Admitting: *Deleted

## 2022-01-31 VITALS — BP 110/74 | HR 93

## 2022-01-31 DIAGNOSIS — O09522 Supervision of elderly multigravida, second trimester: Secondary | ICD-10-CM | POA: Diagnosis present

## 2022-01-31 DIAGNOSIS — O09521 Supervision of elderly multigravida, first trimester: Secondary | ICD-10-CM

## 2022-01-31 DIAGNOSIS — Z3689 Encounter for other specified antenatal screening: Secondary | ICD-10-CM | POA: Insufficient documentation

## 2022-03-12 LAB — OB RESULTS CONSOLE RPR: RPR: NONREACTIVE

## 2022-03-12 LAB — OB RESULTS CONSOLE HIV ANTIBODY (ROUTINE TESTING): HIV: NONREACTIVE

## 2022-03-12 LAB — OB RESULTS CONSOLE ANTIBODY SCREEN: Antibody Screen: NEGATIVE

## 2022-03-12 LAB — OB RESULTS CONSOLE RUBELLA ANTIBODY, IGM: Rubella: IMMUNE

## 2022-03-12 LAB — OB RESULTS CONSOLE ABO/RH: RH Type: POSITIVE

## 2022-03-12 LAB — HEPATITIS C ANTIBODY: HCV Ab: NEGATIVE

## 2022-03-12 LAB — OB RESULTS CONSOLE HEPATITIS B SURFACE ANTIGEN: Hepatitis B Surface Ag: NEGATIVE

## 2022-03-14 ENCOUNTER — Ambulatory Visit: Payer: BC Managed Care – PPO

## 2022-03-14 ENCOUNTER — Other Ambulatory Visit: Payer: Commercial Indemnity

## 2022-07-11 LAB — OB RESULTS CONSOLE GBS: GBS: NEGATIVE

## 2022-07-28 NOTE — L&D Delivery Note (Signed)
Delivery Note At 7:37 AM a viable and healthy female was delivered via Vaginal, Spontaneous (Presentation: Right Occiput Anterior).  APGAR: 8, 9; weight  pending.   Placenta status: Spontaneous, Intact.  Cord: 3 vessels with the following complications: None.  Cord pH: na  Anesthesia: Epidural Episiotomy: None Lacerations: 1st degree;Periurethral Suture Repair: 3.0 vicryl rapide Est. Blood Loss (mL): 155  Mom to postpartum.  Baby to Couplet care / Skin to Skin.  Bernece Gall J 08/18/2022, 7:54 AM

## 2022-08-14 ENCOUNTER — Telehealth (HOSPITAL_COMMUNITY): Payer: Self-pay | Admitting: *Deleted

## 2022-08-14 ENCOUNTER — Encounter (HOSPITAL_COMMUNITY): Payer: Self-pay | Admitting: *Deleted

## 2022-08-14 ENCOUNTER — Other Ambulatory Visit: Payer: Self-pay | Admitting: Obstetrics and Gynecology

## 2022-08-14 NOTE — Telephone Encounter (Signed)
Preadmission screen  

## 2022-08-18 ENCOUNTER — Inpatient Hospital Stay (HOSPITAL_COMMUNITY): Payer: BC Managed Care – PPO

## 2022-08-18 ENCOUNTER — Inpatient Hospital Stay (HOSPITAL_COMMUNITY): Payer: BC Managed Care – PPO | Admitting: Anesthesiology

## 2022-08-18 ENCOUNTER — Other Ambulatory Visit: Payer: Self-pay

## 2022-08-18 ENCOUNTER — Inpatient Hospital Stay (HOSPITAL_COMMUNITY)
Admission: RE | Admit: 2022-08-18 | Discharge: 2022-08-19 | DRG: 806 | Disposition: A | Discharge status: Home / Self Care | Payer: BC Managed Care – PPO | Attending: Obstetrics and Gynecology | Admitting: Obstetrics and Gynecology

## 2022-08-18 ENCOUNTER — Encounter (HOSPITAL_COMMUNITY): Payer: Self-pay | Admitting: Obstetrics and Gynecology

## 2022-08-18 DIAGNOSIS — O480 Post-term pregnancy: Principal | ICD-10-CM | POA: Diagnosis present

## 2022-08-18 DIAGNOSIS — D6959 Other secondary thrombocytopenia: Secondary | ICD-10-CM | POA: Diagnosis present

## 2022-08-18 DIAGNOSIS — O9912 Other diseases of the blood and blood-forming organs and certain disorders involving the immune mechanism complicating childbirth: Secondary | ICD-10-CM | POA: Diagnosis present

## 2022-08-18 DIAGNOSIS — Z3A41 41 weeks gestation of pregnancy: Secondary | ICD-10-CM

## 2022-08-18 DIAGNOSIS — D696 Thrombocytopenia, unspecified: Secondary | ICD-10-CM | POA: Diagnosis present

## 2022-08-18 DIAGNOSIS — O99119 Other diseases of the blood and blood-forming organs and certain disorders involving the immune mechanism complicating pregnancy, unspecified trimester: Secondary | ICD-10-CM | POA: Diagnosis present

## 2022-08-18 DIAGNOSIS — Z3490 Encounter for supervision of normal pregnancy, unspecified, unspecified trimester: Secondary | ICD-10-CM | POA: Diagnosis present

## 2022-08-18 LAB — CBC
HCT: 32.4 % — ABNORMAL LOW (ref 36.0–46.0)
HCT: 32.9 % — ABNORMAL LOW (ref 36.0–46.0)
Hemoglobin: 11.3 g/dL — ABNORMAL LOW (ref 12.0–15.0)
Hemoglobin: 11.6 g/dL — ABNORMAL LOW (ref 12.0–15.0)
MCH: 31.8 pg (ref 26.0–34.0)
MCH: 32.6 pg (ref 26.0–34.0)
MCHC: 34.3 g/dL (ref 30.0–36.0)
MCHC: 35.8 g/dL (ref 30.0–36.0)
MCV: 91 fL (ref 80.0–100.0)
MCV: 92.7 fL (ref 80.0–100.0)
Platelets: 133 10*3/uL — ABNORMAL LOW (ref 150–400)
Platelets: 143 10*3/uL — ABNORMAL LOW (ref 150–400)
RBC: 3.55 MIL/uL — ABNORMAL LOW (ref 3.87–5.11)
RBC: 3.56 MIL/uL — ABNORMAL LOW (ref 3.87–5.11)
RDW: 13.2 % (ref 11.5–15.5)
RDW: 13.3 % (ref 11.5–15.5)
WBC: 6.9 10*3/uL (ref 4.0–10.5)
WBC: 9 10*3/uL (ref 4.0–10.5)
nRBC: 0 % (ref 0.0–0.2)
nRBC: 0 % (ref 0.0–0.2)

## 2022-08-18 LAB — TYPE AND SCREEN
ABO/RH(D): A POS
Antibody Screen: NEGATIVE

## 2022-08-18 LAB — RPR: RPR Ser Ql: NONREACTIVE

## 2022-08-18 MED ORDER — COCONUT OIL OIL: 1.0000 | TOPICAL_OIL | Status: DC | PRN

## 2022-08-18 MED ORDER — SENNOSIDES-DOCUSATE SODIUM 8.6-50 MG PO TABS
2.0000 | ORAL_TABLET | Freq: Every day | ORAL | Status: DC
  Administered 2022-08-19: 2 via ORAL
  Filled 2022-08-18: qty 2

## 2022-08-18 MED ORDER — WITCH HAZEL-GLYCERIN EX PADS: 1.0000 | MEDICATED_PAD | CUTANEOUS | Status: DC | PRN

## 2022-08-18 MED ORDER — MISOPROSTOL 25 MCG QUARTER TABLET: 25.0000 ug | ORAL_TABLET | ORAL | Status: DC | PRN

## 2022-08-18 MED ORDER — BUPIVACAINE HCL (PF) 0.25 % IJ SOLN
INTRAMUSCULAR | Status: DC | PRN
  Administered 2022-08-18: 1.4 mL via INTRATHECAL

## 2022-08-18 MED ORDER — ZOLPIDEM TARTRATE 5 MG PO TABS: 5.0000 mg | ORAL_TABLET | Freq: Every evening | ORAL | Status: DC | PRN

## 2022-08-18 MED ORDER — PHENYLEPHRINE 80 MCG/ML (10ML) SYRINGE FOR IV PUSH (FOR BLOOD PRESSURE SUPPORT): 80.0000 ug | PREFILLED_SYRINGE | INTRAVENOUS | Status: DC | PRN

## 2022-08-18 MED ORDER — DIBUCAINE (PERIANAL) 1 % EX OINT: 1.0000 | TOPICAL_OINTMENT | CUTANEOUS | Status: DC | PRN

## 2022-08-18 MED ORDER — LIDOCAINE HCL (PF) 1 % IJ SOLN: 30.0000 mL | INTRAMUSCULAR | Status: DC | PRN

## 2022-08-18 MED ORDER — OXYTOCIN BOLUS FROM INFUSION
333.0000 mL | Freq: Once | INTRAVENOUS | Status: AC
  Administered 2022-08-18: 333 mL via INTRAVENOUS

## 2022-08-18 MED ORDER — SIMETHICONE 80 MG PO CHEW: 80.0000 mg | CHEWABLE_TABLET | ORAL | Status: DC | PRN

## 2022-08-18 MED ORDER — OXYTOCIN-SODIUM CHLORIDE 30-0.9 UT/500ML-% IV SOLN: 2.5000 [IU]/h | INTRAVENOUS | Status: DC

## 2022-08-18 MED ORDER — EPHEDRINE 5 MG/ML INJ: 10.0000 mg | INTRAVENOUS | Status: DC | PRN

## 2022-08-18 MED ORDER — DIPHENHYDRAMINE HCL 50 MG/ML IJ SOLN: 12.5000 mg | INTRAMUSCULAR | Status: DC | PRN

## 2022-08-18 MED ORDER — ONDANSETRON HCL 4 MG PO TABS: 4.0000 mg | ORAL_TABLET | ORAL | Status: DC | PRN

## 2022-08-18 MED ORDER — METHYLERGONOVINE MALEATE 0.2 MG PO TABS: 0.2000 mg | ORAL_TABLET | ORAL | Status: DC | PRN

## 2022-08-18 MED ORDER — OXYCODONE-ACETAMINOPHEN 5-325 MG PO TABS: 1.0000 | ORAL_TABLET | ORAL | Status: DC | PRN

## 2022-08-18 MED ORDER — LACTATED RINGERS IV SOLN: 500.0000 mL | Freq: Once | INTRAVENOUS | Status: DC

## 2022-08-18 MED ORDER — DIPHENHYDRAMINE HCL 25 MG PO CAPS: 25.0000 mg | ORAL_CAPSULE | Freq: Four times a day (QID) | ORAL | Status: DC | PRN

## 2022-08-18 MED ORDER — OXYCODONE-ACETAMINOPHEN 5-325 MG PO TABS: 2.0000 | ORAL_TABLET | ORAL | Status: DC | PRN

## 2022-08-18 MED ORDER — ACETAMINOPHEN 325 MG PO TABS: 650.0000 mg | ORAL_TABLET | ORAL | Status: DC | PRN

## 2022-08-18 MED ORDER — BENZOCAINE-MENTHOL 20-0.5 % EX AERO
1.0000 | INHALATION_SPRAY | CUTANEOUS | Status: DC | PRN
  Filled 2022-08-18: qty 56

## 2022-08-18 MED ORDER — TETANUS-DIPHTH-ACELL PERTUSSIS 5-2.5-18.5 LF-MCG/0.5 IM SUSY: 0.5000 mL | PREFILLED_SYRINGE | Freq: Once | INTRAMUSCULAR | Status: DC

## 2022-08-18 MED ORDER — METHYLERGONOVINE MALEATE 0.2 MG/ML IJ SOLN: 0.2000 mg | INTRAMUSCULAR | Status: DC | PRN

## 2022-08-18 MED ORDER — LACTATED RINGERS IV SOLN: INTRAVENOUS | Status: DC

## 2022-08-18 MED ORDER — ONDANSETRON HCL 4 MG/2ML IJ SOLN: 4.0000 mg | Freq: Four times a day (QID) | INTRAMUSCULAR | Status: DC | PRN

## 2022-08-18 MED ORDER — TERBUTALINE SULFATE 1 MG/ML IJ SOLN: 0.2500 mg | Freq: Once | INTRAMUSCULAR | Status: DC | PRN

## 2022-08-18 MED ORDER — OXYTOCIN-SODIUM CHLORIDE 30-0.9 UT/500ML-% IV SOLN
1.0000 m[IU]/min | INTRAVENOUS | Status: DC
  Administered 2022-08-18: 2 m[IU]/min via INTRAVENOUS
  Filled 2022-08-18: qty 500

## 2022-08-18 MED ORDER — ONDANSETRON HCL 4 MG/2ML IJ SOLN: 4.0000 mg | INTRAMUSCULAR | Status: DC | PRN

## 2022-08-18 MED ORDER — IBUPROFEN 600 MG PO TABS
600.0000 mg | ORAL_TABLET | Freq: Four times a day (QID) | ORAL | Status: DC
  Administered 2022-08-18 – 2022-08-19 (×5): 600 mg via ORAL
  Filled 2022-08-18 (×6): qty 1

## 2022-08-18 MED ORDER — SOD CITRATE-CITRIC ACID 500-334 MG/5ML PO SOLN: 30.0000 mL | ORAL | Status: DC | PRN

## 2022-08-18 MED ORDER — FENTANYL-BUPIVACAINE-NACL 0.5-0.125-0.9 MG/250ML-% EP SOLN
EPIDURAL | Status: DC | PRN
  Administered 2022-08-18: 12 mL/h via EPIDURAL

## 2022-08-18 MED ORDER — PRENATAL MULTIVITAMIN CH
1.0000 | ORAL_TABLET | Freq: Every day | ORAL | Status: DC
  Filled 2022-08-18: qty 1

## 2022-08-18 MED ORDER — LACTATED RINGERS IV SOLN: 500.0000 mL | INTRAVENOUS | Status: DC | PRN

## 2022-08-18 MED ORDER — FENTANYL-BUPIVACAINE-NACL 0.5-0.125-0.9 MG/250ML-% EP SOLN
12.0000 mL/h | EPIDURAL | Status: DC | PRN
  Filled 2022-08-18: qty 250

## 2022-08-18 NOTE — Anesthesia Preprocedure Evaluation (Addendum)
Anesthesia Evaluation  Patient identified by MRN, date of birth, ID band Patient awake    Reviewed: Allergy & Precautions, Patient's Chart, lab work & pertinent test results  Airway Mallampati: II  TM Distance: >3 FB Neck ROM: Full    Dental no notable dental hx.    Pulmonary neg pulmonary ROS   Pulmonary exam normal breath sounds clear to auscultation       Cardiovascular negative cardio ROS Normal cardiovascular exam Rhythm:Regular Rate:Normal     Neuro/Psych  Headaches PSYCHIATRIC DISORDERS  Depression       GI/Hepatic negative GI ROS, Neg liver ROS,,,  Endo/Other  negative endocrine ROS    Renal/GU negative Renal ROS  negative genitourinary   Musculoskeletal negative musculoskeletal ROS (+)    Abdominal   Peds negative pediatric ROS (+)  Hematology  (+) Blood dyscrasia, anemia Hb 11.6, plt 143 Gestational thrombocytopenia    Anesthesia Other Findings   Reproductive/Obstetrics (+) Pregnancy                             Anesthesia Physical Anesthesia Plan  ASA: 2  Anesthesia Plan: Combined Spinal and Epidural   Post-op Pain Management:    Induction:   PONV Risk Score and Plan: 2  Airway Management Planned: Natural Airway  Additional Equipment: None  Intra-op Plan:   Post-operative Plan:   Informed Consent: I have reviewed the patients History and Physical, chart, labs and discussed the procedure including the risks, benefits and alternatives for the proposed anesthesia with the patient or authorized representative who has indicated his/her understanding and acceptance.       Plan Discussed with:   Anesthesia Plan Comments:        Anesthesia Quick Evaluation

## 2022-08-18 NOTE — Anesthesia Postprocedure Evaluation (Signed)
Anesthesia Post Note  Patient: Maria Douglas  Procedure(s) Performed: AN AD Storla INTUBATION     Patient location during evaluation: Mother Baby Anesthesia Type: Epidural Level of consciousness: awake and alert Pain management: pain level controlled Vital Signs Assessment: post-procedure vital signs reviewed and stable Respiratory status: spontaneous breathing, nonlabored ventilation and respiratory function stable Cardiovascular status: stable Postop Assessment: no headache, no backache and epidural receding Anesthetic complications: no   No notable events documented.  Last Vitals:  Vitals:   08/18/22 1040 08/18/22 1440  BP: 106/67 118/71  Pulse: (!) 57 70  Resp: 16 16  Temp: 36.7 C 36.4 C  SpO2: 99% 98%    Last Pain:  Vitals:   08/18/22 1440  TempSrc: Tympanic  PainSc: 4    Pain Goal:                   [name]

## 2022-08-18 NOTE — Lactation Note (Signed)
This note was copied from a baby's chart. Lactation Consultation Note  Patient Name: Maria Douglas STMHD'Q Date: 08/18/2022 Reason for consult: Initial assessment;Term Age:36 hours   P4: Term infant at 41+4 weeks Feeding preference; Breast  'Karlene Lineman" was asleep in mother's arms when I arrived.  Mother reported a good breast feeding session since birth; LATCH score of a 10.  Baby has had 2 stools.  Encouraged to feed on cue or at least 8-12 times/24 hours.  Breast feeding basics reviewed.  Mother feels confident in her breast feeding skills and does not feel a need for further lactation support.  I will make her a prn status.  Mother is aware that she may call for assistance if needed.  Father present.   Maternal Data Has patient been taught Hand Expression?: No (Mother familar with hand expression) Does the patient have breastfeeding experience prior to this delivery?: Yes How long did the patient breastfeed?: 1 year with each of her other children  Feeding Mother's Current Feeding Choice: Breast Milk  LATCH Score                    Lactation Tools Discussed/Used    Interventions Interventions: Education;LC Services brochure;Breast feeding basics reviewed  Discharge Pump: Personal  Consult Status Consult Status: PRN Date: 08/18/22 Follow-up type: In-patient    Saidi Santacroce R Makai Dumond 08/18/2022, 1:29 PM

## 2022-08-18 NOTE — Anesthesia Procedure Notes (Signed)
Epidural Patient location during procedure: OB Start time: 08/18/2022 6:29 AM End time: 08/18/2022 6:36 AM  Staffing Anesthesiologist: Pervis Hocking, DO Performed: anesthesiologist   Preanesthetic Checklist Completed: patient identified, IV checked, risks and benefits discussed, monitors and equipment checked, pre-op evaluation and timeout performed  Epidural Patient position: sitting Prep: DuraPrep and site prepped and draped Patient monitoring: continuous pulse ox, blood pressure, heart rate and cardiac monitor Approach: midline Location: L3-L4 Injection technique: LOR air  Needle:  Needle type: Tuohy  Needle gauge: 17 G Needle length: 9 cm Needle insertion depth: 5 cm Catheter type: closed end flexible Catheter size: 19 Gauge Catheter at skin depth: 10 cm Test dose: negative  Assessment Sensory level: T8 Events: blood not aspirated, no cerebrospinal fluid, injection not painful, no injection resistance, no paresthesia and negative IV test  Additional Notes Patient identified. Risks/Benefits/Options discussed with patient including but not limited to bleeding, infection, nerve damage, paralysis, failed block, incomplete pain control, headache, blood pressure changes, nausea, vomiting, reactions to medication both or allergic, itching and postpartum back pain. Confirmed with bedside nurse the patient's most recent platelet count. Confirmed with patient that they are not currently taking any anticoagulation, have any bleeding history or any family history of bleeding disorders. Patient expressed understanding and wished to proceed. All questions were answered. Sterile technique was used throughout the entire procedure. Please see nursing notes for vital signs. Test dose was given through epidural catheter and negative prior to continuing to dose epidural or start infusion. Warning signs of high block given to the patient including shortness of breath, tingling/numbness in  hands, complete motor block, or any concerning symptoms with instructions to call for help. Patient was given instructions on fall risk and not to get out of bed. All questions and concerns addressed with instructions to call with any issues or inadequate analgesia.    CSE performed with 24G sprotte through tuohy, clear CSF no issuesReason for block:procedure for pain

## 2022-08-18 NOTE — Anesthesia Postprocedure Evaluation (Signed)
Anesthesia Post Note  Patient: Maria Douglas  Procedure(s) Performed: AN AD HOC LABOR EPIDURAL     Patient location during evaluation: Mother Baby Anesthesia Type: Epidural Level of consciousness: awake and alert Pain management: pain level controlled Vital Signs Assessment: post-procedure vital signs reviewed and stable Respiratory status: spontaneous breathing, nonlabored ventilation and respiratory function stable Cardiovascular status: stable Postop Assessment: no headache, no backache and epidural receding Anesthetic complications: no  No notable events documented.  Last Vitals:  Vitals:   08/18/22 0705 08/18/22 0750  BP: 100/68 (!) 113/40  Pulse: 87 73  Resp: 15 18  Temp:    SpO2: 98%     Last Pain:  Vitals:   08/18/22 0750  TempSrc:   PainSc: 0-No pain   Pain Goal:                Epidural/Spinal Function Cutaneous sensation: Tingles (08/18/22 0750), Patient able to flex knees: Yes (08/18/22 0750), Patient able to lift hips off bed: Yes (08/18/22 0750), Back pain beyond tenderness at insertion site: No (08/18/22 0750), Progressively worsening motor and/or sensory loss: No (08/18/22 0750), Bowel and/or bladder incontinence post epidural: No (08/18/22 0750)  Laddie Naeem

## 2022-08-18 NOTE — H&P (Signed)
Maria Douglas is a 37 y.o. female presenting for postdates iol. OB History     Gravida  5   Para  3   Term  3   Preterm      AB  1   Living  3      SAB  1   IAB      Ectopic      Multiple  0   Live Births  3          Past Medical History:  Diagnosis Date   Anemia 2010   NO MEDS   Depression 2005   NO MEDS   Headache(784.0)    OCC   Heart murmur    HAD LONG TIME; WAS ADVISED PROPHYLAXSIS FOR DENTIST   Hx: UTI (urinary tract infection) 2012   PCOS (polycystic ovarian syndrome) 2012   Pyelonephritis    Thrombocytopenia (HCC)    Past Surgical History:  Procedure Laterality Date   COLPOSCOPY W/ BIOPSY / CURETTAGE     WISDOM TOOTH EXTRACTION     Family History: family history includes Anemia in her paternal grandfather; Cancer in her maternal grandfather and paternal grandfather; Depression in her mother; Down syndrome in her paternal uncle; Early death (age of onset: 15) in her maternal grandmother; Heart attack in her paternal grandfather; Heart disease in her maternal grandfather and paternal grandfather; Hypertension in her paternal grandmother; Migraines in her mother; Mitral valve prolapse in her mother; Other in her mother; Stroke in her maternal grandfather and paternal uncle. Social History:  reports that she has never smoked. She has never used smokeless tobacco. She reports that she does not currently use alcohol after a past usage of about 4.0 - 5.0 standard drinks of alcohol per week. She reports that she does not use drugs.     Maternal Diabetes: No Genetic Screening: Normal Maternal Ultrasounds/Referrals: Normal Fetal Ultrasounds or other Referrals:  None Maternal Substance Abuse:  No Significant Maternal Medications:  None Significant Maternal Lab Results:  Group B Strep negative Number of Prenatal Visits:greater than 3 verified prenatal visits Other Comments:  None  Review of Systems  Constitutional: Negative.   All other systems  reviewed and are negative.  Maternal Medical History:  Reason for admission: Contractions.   Contractions: Onset was 1-2 hours ago.   Frequency: rare.   Fetal activity: Last perceived fetal movement was within the past hour.   Prenatal complications: Thrombocytopenia.   Prenatal Complications - Diabetes: none.   Dilation: 2.5 Effacement (%): 50 Station: -3 Exam by:: ADeirdre Pippins, RN Blood pressure (!) 132/90, pulse 74, temperature 98.1 F (36.7 C), temperature source Oral, resp. rate 15, height 5\' 6"  (1.676 m), weight 82.6 kg, unknown if currently breastfeeding. Maternal Exam:  Uterine Assessment: Contraction strength is mild.  Contraction frequency is rare.  Introitus: Normal vulva. Normal vagina.  Ferning test: not done.  Nitrazine test: not done.   Physical Exam Constitutional:      Appearance: Normal appearance. She is normal weight.  HENT:     Head: Normocephalic and atraumatic.     Mouth/Throat:     Mouth: Mucous membranes are dry.  Cardiovascular:     Rate and Rhythm: Normal rate and regular rhythm.  Pulmonary:     Effort: Pulmonary effort is normal.     Breath sounds: Normal breath sounds.  Abdominal:     General: Bowel sounds are normal.     Palpations: Abdomen is soft.  Genitourinary:    General: Normal vulva.  Musculoskeletal:        General: Normal range of motion.     Cervical back: Neck supple.  Skin:    General: Skin is warm.  Neurological:     General: No focal deficit present.     Mental Status: She is alert.  Psychiatric:        Mood and Affect: Mood normal.     Prenatal labs: ABO, Rh: --/--/A POS (01/22 0025) Antibody: NEG (01/22 0025) Rubella: Immune (08/16 0000) RPR: Nonreactive (08/16 0000)  HBsAg: Negative (08/16 0000)  HIV: Non-reactive (08/16 0000)  GBS: Negative/-- (12/15 0000)   Assessment/Plan: Postdates IOL( pt choice) Cytotec then pitocin prn Gest thrombocytopenia   Thai Hemrick J 08/18/2022, 6:47 AM

## 2022-08-19 LAB — CBC
HCT: 31.4 % — ABNORMAL LOW (ref 36.0–46.0)
Hemoglobin: 10.6 g/dL — ABNORMAL LOW (ref 12.0–15.0)
MCH: 31.8 pg (ref 26.0–34.0)
MCHC: 33.8 g/dL (ref 30.0–36.0)
MCV: 94.3 fL (ref 80.0–100.0)
Platelets: 119 10*3/uL — ABNORMAL LOW (ref 150–400)
RBC: 3.33 MIL/uL — ABNORMAL LOW (ref 3.87–5.11)
RDW: 13.6 % (ref 11.5–15.5)
WBC: 8.2 10*3/uL (ref 4.0–10.5)
nRBC: 0 % (ref 0.0–0.2)

## 2022-08-19 NOTE — Progress Notes (Signed)
Post Partum Day 1 Subjective: no complaints, up ad lib, voiding, and tolerating PO  Objective: Blood pressure 102/75, pulse 71, temperature 98.2 F (36.8 C), temperature source Oral, resp. rate 18, height 5\' 6"  (1.676 m), weight 82.6 kg, SpO2 97 %, unknown if currently breastfeeding.  Physical Exam:  General: alert, cooperative, and appears stated age Lochia: appropriate Uterine Fundus: firm Incision: healing well DVT Evaluation: No evidence of DVT seen on physical exam. Negative Homan's sign.  Recent Labs    08/18/22 0815 08/19/22 0528  HGB 11.3* 10.6*  HCT 32.9* 31.4*    Assessment/Plan: Stable PPD 1 Outpateint circumcision scheduled Fu office 6w Discharge home and Breastfeeding   LOS: 1 day   Lovenia Kim, MD 08/19/2022, 9:24 AM

## 2022-08-19 NOTE — Discharge Summary (Signed)
OB Discharge Summary  Patient Name: Maria Douglas DOB: 13-Dec-1985 MRN: [id number]  Date of admission: 08/18/2022 Delivering provider: Brien Few   Admitting diagnosis: Encounter for induction of labor [Z34.90] Intrauterine pregnancy: [redacted]w[redacted]d     Secondary diagnosis: Patient Active Problem List   Diagnosis Date Noted   Encounter for induction of labor 08/18/2022   Postpartum care following vaginal delivery 1/22 08/18/2022   First degree perineal laceration 08/18/2022   Gestational thrombocytopenia ([place]) 11/23/2014   SVD (spontaneous vaginal delivery) 11/08/2012   Additional problems:na   Date of discharge: 08/19/2022   Discharge diagnosis: Principal Problem:   Postpartum care following vaginal delivery 1/22 Active Problems:   SVD (spontaneous vaginal delivery)   Gestational thrombocytopenia ([place])   Encounter for induction of labor   First degree perineal laceration                                                              Post partum procedures: na  Augmentation: Cytotec Pain control: Epidural  Laceration:1st degree;Periurethral  Episiotomy:None  Complications: None  Hospital course:  Induction of Labor With Vaginal Delivery   37 y.o. yo [id number] at 103w4d was admitted to the hospital 08/18/2022 for induction of labor.  Indication for induction: Postdates.  Patient had an labor course complicated byna Membrane Rupture Time/Date: 7:06 AM ,08/18/2022   Delivery Method:Vaginal, Spontaneous  Episiotomy: None  Lacerations:  1st degree;Periurethral  Details of delivery can be found in separate delivery note.  Patient had a postpartum course complicated byna. Patient is discharged home 08/19/22.  Newborn Data: Birth date:08/18/2022  Birth time:7:37 AM  Gender:Female  Living status:Living  Apgars:8 ,9  Weight:3600 g   Subjective: na Physical exam  Vitals:   08/18/22 1440 08/18/22 1832 08/18/22 2232 08/19/22 0543  BP: 118/71 (!) 118/2 112/74 102/75  Pulse: 70  70 65 71  Resp: 16 18 18 18   Temp: 97.6 F (36.4 C) 98.2 F (36.8 C) 98.2 F (36.8 C) 98.2 F (36.8 C)  TempSrc: Oral Oral Oral Oral  SpO2: 98% 98% 98% 97%  Weight:      Height:       General: alert, cooperative, and no distress Lochia: appropriate Uterine Fundus: firm Incision: Healing well with no significant drainage Perineum: repair intact, tr edema DVT Evaluation: No evidence of DVT seen on physical exam. Labs: Lab Results  Component Value Date   WBC 8.2 08/19/2022   HGB 10.6 (L) 08/19/2022   HCT 31.4 (L) 08/19/2022   MCV 94.3 08/19/2022   PLT 119 (L) 08/19/2022       No data to display            08/18/2022    9:24 AM 09/16/2017   10:31 AM  Edinburgh Postnatal Depression Scale Screening Tool  I have been able to laugh and see the funny side of things. 0 0  I have looked forward with enjoyment to things. 0 0  I have blamed myself unnecessarily when things went wrong. 1 1  I have been anxious or worried for no good reason. 1 2  I have felt scared or panicky for no good reason. 1 1  Things have been getting on top of me. 0 1  I have been so unhappy that I have had difficulty sleeping.  0 0  I have felt sad or miserable. 0 0  I have been so unhappy that I have been crying. 1 1  The thought of harming myself has occurred to me. 0 0  Edinburgh Postnatal Depression Scale Total 4 6   Vaccines: TDaP         na         Flu na Discharge instruction:  per After Visit Summary,  [facility] booklet and  "[facility]" hospital booklet After Visit Meds:  Allergies as of 08/19/2022       Reactions   Minocycline Itching, Swelling        Medication List     STOP taking these medications    prenatal multivitamin Tabs tablet       Diet: routine diet Activity: Advance as tolerated. Pelvic rest for 6 weeks.  Postpartum contraception: TBA in office Newborn Data: Live born female  Birth Weight: 7 lb 15 oz (3600 g) APGAR: 47, 35  Newborn  Delivery   Birth date/time: 08/18/2022 07:37:00 Delivery type: Vaginal, Spontaneous      named na Baby Feeding: Breast Disposition:home with mother Circumcision: outpt Delivery Report: Review the Delivery Report for details.   Follow up:  Signed: Simmie Davies, MSN 08/19/2022, 9:26 AM

## 2022-08-19 NOTE — Discharge Instructions (Signed)
May take Ibuprofen again after 5:20pm on 08/19/2022 if needed

## 2022-08-27 ENCOUNTER — Telehealth (HOSPITAL_COMMUNITY): Payer: Self-pay | Admitting: *Deleted

## 2022-08-27 NOTE — Telephone Encounter (Signed)
Left phone voicemail message.  Odis Hollingshead, RN 08-27-2022 at 2:55pm
